# Patient Record
Sex: Female | Born: 1935
Health system: Southern US, Community
[De-identification: ages and names within clinical notes are randomized; demographics above are authoritative.]

## PROBLEM LIST (undated history)

## (undated) DIAGNOSIS — R42 Dizziness and giddiness: Secondary | ICD-10-CM

## (undated) DIAGNOSIS — J45909 Unspecified asthma, uncomplicated: Secondary | ICD-10-CM

## (undated) DIAGNOSIS — I10 Essential (primary) hypertension: Secondary | ICD-10-CM

## (undated) DIAGNOSIS — M1990 Unspecified osteoarthritis, unspecified site: Secondary | ICD-10-CM

## (undated) DIAGNOSIS — H269 Unspecified cataract: Secondary | ICD-10-CM

## (undated) DIAGNOSIS — Z974 Presence of external hearing-aid: Secondary | ICD-10-CM

## (undated) HISTORY — DX: Unspecified asthma, uncomplicated: J45.909

## (undated) HISTORY — PX: APPENDECTOMY: SHX54

## (undated) HISTORY — PX: CATARACT EXTRACTION: SUR2

## (undated) HISTORY — PX: TONSILLECTOMY: SUR1361

## (undated) HISTORY — DX: Unspecified cataract: H26.9

## (undated) HISTORY — DX: Unspecified osteoarthritis, unspecified site: M19.90

---

## 1997-07-13 ENCOUNTER — Other Ambulatory Visit: Admission: RE | Admit: 1997-07-13 | Discharge: 1997-07-13 | Payer: Self-pay | Admitting: Obstetrics and Gynecology

## 1997-07-13 ENCOUNTER — Ambulatory Visit (HOSPITAL_COMMUNITY): Admission: RE | Admit: 1997-07-13 | Discharge: 1997-07-13 | Payer: Self-pay | Admitting: Obstetrics and Gynecology

## 1997-07-26 ENCOUNTER — Ambulatory Visit (HOSPITAL_COMMUNITY): Admission: RE | Admit: 1997-07-26 | Discharge: 1997-07-26 | Payer: Self-pay | Admitting: Obstetrics and Gynecology

## 1998-10-25 ENCOUNTER — Other Ambulatory Visit: Admission: RE | Admit: 1998-10-25 | Discharge: 1998-10-25 | Payer: Self-pay | Admitting: Obstetrics and Gynecology

## 1998-11-15 ENCOUNTER — Ambulatory Visit (HOSPITAL_COMMUNITY): Admission: RE | Admit: 1998-11-15 | Discharge: 1998-11-15 | Payer: Self-pay | Admitting: Obstetrics and Gynecology

## 1998-11-15 ENCOUNTER — Encounter: Payer: Self-pay | Admitting: Obstetrics and Gynecology

## 1999-06-11 ENCOUNTER — Ambulatory Visit (HOSPITAL_COMMUNITY): Admission: RE | Admit: 1999-06-11 | Discharge: 1999-06-11 | Payer: Self-pay | Admitting: Obstetrics and Gynecology

## 1999-06-11 ENCOUNTER — Encounter (INDEPENDENT_AMBULATORY_CARE_PROVIDER_SITE_OTHER): Payer: Self-pay | Admitting: Specialist

## 1999-06-19 ENCOUNTER — Encounter: Admission: RE | Admit: 1999-06-19 | Discharge: 1999-06-19 | Payer: Self-pay | Admitting: Obstetrics and Gynecology

## 1999-06-19 ENCOUNTER — Encounter: Payer: Self-pay | Admitting: Obstetrics and Gynecology

## 1999-08-15 ENCOUNTER — Other Ambulatory Visit: Admission: RE | Admit: 1999-08-15 | Discharge: 1999-08-15 | Payer: Self-pay | Admitting: Obstetrics and Gynecology

## 1999-11-28 ENCOUNTER — Ambulatory Visit (HOSPITAL_COMMUNITY): Admission: RE | Admit: 1999-11-28 | Discharge: 1999-11-28 | Payer: Self-pay | Admitting: Obstetrics and Gynecology

## 1999-11-28 ENCOUNTER — Encounter: Payer: Self-pay | Admitting: Obstetrics and Gynecology

## 2000-10-23 ENCOUNTER — Other Ambulatory Visit: Admission: RE | Admit: 2000-10-23 | Discharge: 2000-10-23 | Payer: Self-pay | Admitting: Obstetrics and Gynecology

## 2001-02-10 ENCOUNTER — Encounter: Payer: Self-pay | Admitting: Orthopedic Surgery

## 2001-02-10 ENCOUNTER — Encounter: Admission: RE | Admit: 2001-02-10 | Discharge: 2001-02-10 | Payer: Self-pay | Admitting: Orthopedic Surgery

## 2001-07-14 ENCOUNTER — Ambulatory Visit (HOSPITAL_COMMUNITY): Admission: RE | Admit: 2001-07-14 | Discharge: 2001-07-14 | Payer: Self-pay | Admitting: Family Medicine

## 2001-07-14 ENCOUNTER — Encounter: Payer: Self-pay | Admitting: Family Medicine

## 2001-12-07 ENCOUNTER — Other Ambulatory Visit: Admission: RE | Admit: 2001-12-07 | Discharge: 2001-12-07 | Payer: Self-pay | Admitting: Obstetrics and Gynecology

## 2003-01-19 ENCOUNTER — Ambulatory Visit (HOSPITAL_COMMUNITY): Admission: RE | Admit: 2003-01-19 | Discharge: 2003-01-19 | Payer: Self-pay | Admitting: Obstetrics and Gynecology

## 2003-01-19 ENCOUNTER — Encounter: Payer: Self-pay | Admitting: Obstetrics and Gynecology

## 2003-11-08 ENCOUNTER — Other Ambulatory Visit: Admission: RE | Admit: 2003-11-08 | Discharge: 2003-11-08 | Payer: Self-pay | Admitting: Obstetrics and Gynecology

## 2004-02-29 ENCOUNTER — Ambulatory Visit (HOSPITAL_COMMUNITY): Admission: RE | Admit: 2004-02-29 | Discharge: 2004-02-29 | Payer: Self-pay | Admitting: Obstetrics and Gynecology

## 2004-04-25 ENCOUNTER — Encounter: Admission: RE | Admit: 2004-04-25 | Discharge: 2004-04-25 | Payer: Self-pay | Admitting: Obstetrics and Gynecology

## 2005-06-10 ENCOUNTER — Other Ambulatory Visit: Admission: RE | Admit: 2005-06-10 | Discharge: 2005-06-10 | Payer: Self-pay | Admitting: Obstetrics and Gynecology

## 2005-06-25 ENCOUNTER — Ambulatory Visit (HOSPITAL_COMMUNITY): Admission: RE | Admit: 2005-06-25 | Discharge: 2005-06-25 | Payer: Self-pay | Admitting: Obstetrics and Gynecology

## 2006-09-29 ENCOUNTER — Ambulatory Visit (HOSPITAL_COMMUNITY): Admission: RE | Admit: 2006-09-29 | Discharge: 2006-09-29 | Payer: Self-pay | Admitting: Obstetrics and Gynecology

## 2007-08-27 ENCOUNTER — Other Ambulatory Visit: Admission: RE | Admit: 2007-08-27 | Discharge: 2007-08-27 | Payer: Self-pay | Admitting: Obstetrics and Gynecology

## 2007-12-08 ENCOUNTER — Ambulatory Visit (HOSPITAL_COMMUNITY): Admission: RE | Admit: 2007-12-08 | Discharge: 2007-12-08 | Payer: Self-pay | Admitting: Obstetrics and Gynecology

## 2009-01-04 ENCOUNTER — Ambulatory Visit (HOSPITAL_COMMUNITY): Admission: RE | Admit: 2009-01-04 | Discharge: 2009-01-04 | Payer: Self-pay | Admitting: Obstetrics and Gynecology

## 2010-02-07 ENCOUNTER — Ambulatory Visit (HOSPITAL_COMMUNITY): Admission: RE | Admit: 2010-02-07 | Discharge: 2010-02-07 | Payer: Self-pay | Admitting: Obstetrics and Gynecology

## 2010-05-04 ENCOUNTER — Encounter: Payer: Self-pay | Admitting: Obstetrics and Gynecology

## 2010-08-30 NOTE — Op Note (Signed)
[facility]  Patient:    Sharon Hood, Sharon Hood                     MRN: [id number] Proc. Date: 06/11/99 Adm. Date:  [id number] Attending:  Amanda Cockayne                           Operative Report  DATE OF DICTATION:                1445, June 11, 1999.  PREOPERATIVE DIAGNOSIS:           1. Postmenopausal bleeding on Prempro.                                   2. Thickened endometrium on ultrasound.  POSTOPERATIVE DIAGNOSIS:          1. Postmenopausal bleeding on Prempro.                                   2. Thickened endometrium on ultrasound.  OPERATION:                        Dilatation and curettage.  SURGEON:                          Esmeralda Arthur, M.D.  ANESTHESIA:                       General.  PACKS:                            None.  MEDIUM:                           Sorbitol.  DEFICIT:                          10 cc.  FINDINGS:                         The uterus sounded 7 cm was dilated to #23 with no difficulty.  With hysteroscopy, both tube fallopians were visualized and polyps fibroids were seen.  The endometrium was not atrophic.  DESCRIPTION OF PROCEDURE:         The patient was carried to the operating room. After satisfactory anesthesia, the patient was placed in supine position, and she was prepped and draped in sterile field.                                    Examination revealed the uterus to be anterior, and I did not feel enlargement, mass in pelvis and adnexa.                                    The bladder with emptied by catheterization.  The weighted speculum was placed in the posterior vagina.  The cervix was grasped with the retinaculum, and the uterus was dilated with the first dilator and sounded 7 cm.  She was then dilated to a #23.  The observation scope was placed in, and we could see both tubal openings very well. Fundus looked atrophic.  She has  tissue on the posterior wall and the anterior wall.                                    We withdrew the scope, did curet with sharp curet and the serrated curet and got a moderate amount of tissue.                                    The procedure was then terminated.  The patient was carried to the recovery room in good condition. DD:  06/11/99 TD:  06/11/99 Job: [id number]

## 2011-02-21 ENCOUNTER — Other Ambulatory Visit: Payer: Self-pay | Admitting: Obstetrics and Gynecology

## 2011-02-21 DIAGNOSIS — Z1231 Encounter for screening mammogram for malignant neoplasm of breast: Secondary | ICD-10-CM

## 2011-02-27 ENCOUNTER — Ambulatory Visit (HOSPITAL_COMMUNITY)
Admission: RE | Admit: 2011-02-27 | Discharge: 2011-02-27 | Disposition: A | Discharge status: Home / Self Care | Payer: Medicare Other | Source: Ambulatory Visit | Attending: Obstetrics and Gynecology | Admitting: Obstetrics and Gynecology

## 2011-02-27 DIAGNOSIS — Z1231 Encounter for screening mammogram for malignant neoplasm of breast: Secondary | ICD-10-CM | POA: Insufficient documentation

## 2011-05-21 DIAGNOSIS — Z01419 Encounter for gynecological examination (general) (routine) without abnormal findings: Secondary | ICD-10-CM | POA: Diagnosis not present

## 2011-05-21 DIAGNOSIS — Z124 Encounter for screening for malignant neoplasm of cervix: Secondary | ICD-10-CM | POA: Diagnosis not present

## 2011-05-22 DIAGNOSIS — IMO0001 Reserved for inherently not codable concepts without codable children: Secondary | ICD-10-CM | POA: Diagnosis not present

## 2011-05-22 DIAGNOSIS — J45902 Unspecified asthma with status asthmaticus: Secondary | ICD-10-CM | POA: Diagnosis not present

## 2011-05-22 DIAGNOSIS — M3320 Polymyositis, organ involvement unspecified: Secondary | ICD-10-CM | POA: Diagnosis not present

## 2011-06-02 DIAGNOSIS — M2550 Pain in unspecified joint: Secondary | ICD-10-CM | POA: Diagnosis not present

## 2011-06-19 DIAGNOSIS — M2550 Pain in unspecified joint: Secondary | ICD-10-CM | POA: Diagnosis not present

## 2011-06-25 DIAGNOSIS — L821 Other seborrheic keratosis: Secondary | ICD-10-CM | POA: Diagnosis not present

## 2011-06-25 DIAGNOSIS — L570 Actinic keratosis: Secondary | ICD-10-CM | POA: Diagnosis not present

## 2011-06-25 DIAGNOSIS — Z85828 Personal history of other malignant neoplasm of skin: Secondary | ICD-10-CM | POA: Diagnosis not present

## 2011-06-25 DIAGNOSIS — D239 Other benign neoplasm of skin, unspecified: Secondary | ICD-10-CM | POA: Diagnosis not present

## 2011-07-03 DIAGNOSIS — M353 Polymyalgia rheumatica: Secondary | ICD-10-CM | POA: Diagnosis not present

## 2011-07-03 DIAGNOSIS — M159 Polyosteoarthritis, unspecified: Secondary | ICD-10-CM | POA: Diagnosis not present

## 2011-08-14 DIAGNOSIS — M353 Polymyalgia rheumatica: Secondary | ICD-10-CM | POA: Diagnosis not present

## 2011-08-14 DIAGNOSIS — M159 Polyosteoarthritis, unspecified: Secondary | ICD-10-CM | POA: Diagnosis not present

## 2011-12-16 DIAGNOSIS — M159 Polyosteoarthritis, unspecified: Secondary | ICD-10-CM | POA: Diagnosis not present

## 2011-12-16 DIAGNOSIS — M353 Polymyalgia rheumatica: Secondary | ICD-10-CM | POA: Diagnosis not present

## 2012-01-14 DIAGNOSIS — Z85828 Personal history of other malignant neoplasm of skin: Secondary | ICD-10-CM | POA: Diagnosis not present

## 2012-01-14 DIAGNOSIS — D239 Other benign neoplasm of skin, unspecified: Secondary | ICD-10-CM | POA: Diagnosis not present

## 2012-01-14 DIAGNOSIS — L570 Actinic keratosis: Secondary | ICD-10-CM | POA: Diagnosis not present

## 2012-01-14 DIAGNOSIS — L820 Inflamed seborrheic keratosis: Secondary | ICD-10-CM | POA: Diagnosis not present

## 2012-01-14 DIAGNOSIS — I781 Nevus, non-neoplastic: Secondary | ICD-10-CM | POA: Diagnosis not present

## 2012-01-14 DIAGNOSIS — L821 Other seborrheic keratosis: Secondary | ICD-10-CM | POA: Diagnosis not present

## 2012-04-19 DIAGNOSIS — M159 Polyosteoarthritis, unspecified: Secondary | ICD-10-CM | POA: Diagnosis not present

## 2012-04-19 DIAGNOSIS — M353 Polymyalgia rheumatica: Secondary | ICD-10-CM | POA: Diagnosis not present

## 2012-04-27 DIAGNOSIS — Z23 Encounter for immunization: Secondary | ICD-10-CM | POA: Diagnosis not present

## 2012-07-29 DIAGNOSIS — I781 Nevus, non-neoplastic: Secondary | ICD-10-CM | POA: Diagnosis not present

## 2012-07-29 DIAGNOSIS — Z85828 Personal history of other malignant neoplasm of skin: Secondary | ICD-10-CM | POA: Diagnosis not present

## 2012-07-29 DIAGNOSIS — L909 Atrophic disorder of skin, unspecified: Secondary | ICD-10-CM | POA: Diagnosis not present

## 2012-07-29 DIAGNOSIS — L570 Actinic keratosis: Secondary | ICD-10-CM | POA: Diagnosis not present

## 2012-07-29 DIAGNOSIS — L821 Other seborrheic keratosis: Secondary | ICD-10-CM | POA: Diagnosis not present

## 2012-07-29 DIAGNOSIS — L919 Hypertrophic disorder of the skin, unspecified: Secondary | ICD-10-CM | POA: Diagnosis not present

## 2012-07-29 DIAGNOSIS — D239 Other benign neoplasm of skin, unspecified: Secondary | ICD-10-CM | POA: Diagnosis not present

## 2012-07-29 DIAGNOSIS — L820 Inflamed seborrheic keratosis: Secondary | ICD-10-CM | POA: Diagnosis not present

## 2012-08-14 DIAGNOSIS — S81009A Unspecified open wound, unspecified knee, initial encounter: Secondary | ICD-10-CM | POA: Diagnosis not present

## 2012-08-14 DIAGNOSIS — Z23 Encounter for immunization: Secondary | ICD-10-CM | POA: Diagnosis not present

## 2012-08-14 DIAGNOSIS — S81809A Unspecified open wound, unspecified lower leg, initial encounter: Secondary | ICD-10-CM | POA: Diagnosis not present

## 2012-08-14 DIAGNOSIS — M79609 Pain in unspecified limb: Secondary | ICD-10-CM | POA: Diagnosis not present

## 2012-08-14 DIAGNOSIS — Z79899 Other long term (current) drug therapy: Secondary | ICD-10-CM | POA: Diagnosis not present

## 2012-08-16 DIAGNOSIS — T148XXA Other injury of unspecified body region, initial encounter: Secondary | ICD-10-CM | POA: Diagnosis not present

## 2012-08-27 DIAGNOSIS — T148XXA Other injury of unspecified body region, initial encounter: Secondary | ICD-10-CM | POA: Diagnosis not present

## 2012-10-26 DIAGNOSIS — H9190 Unspecified hearing loss, unspecified ear: Secondary | ICD-10-CM | POA: Diagnosis not present

## 2012-10-26 DIAGNOSIS — H6120 Impacted cerumen, unspecified ear: Secondary | ICD-10-CM | POA: Diagnosis not present

## 2013-01-13 DIAGNOSIS — L820 Inflamed seborrheic keratosis: Secondary | ICD-10-CM | POA: Diagnosis not present

## 2013-01-13 DIAGNOSIS — L821 Other seborrheic keratosis: Secondary | ICD-10-CM | POA: Diagnosis not present

## 2013-01-13 DIAGNOSIS — L570 Actinic keratosis: Secondary | ICD-10-CM | POA: Diagnosis not present

## 2013-01-13 DIAGNOSIS — M6740 Ganglion, unspecified site: Secondary | ICD-10-CM | POA: Diagnosis not present

## 2013-01-13 DIAGNOSIS — C44621 Squamous cell carcinoma of skin of unspecified upper limb, including shoulder: Secondary | ICD-10-CM | POA: Diagnosis not present

## 2013-01-13 DIAGNOSIS — D485 Neoplasm of uncertain behavior of skin: Secondary | ICD-10-CM | POA: Diagnosis not present

## 2013-02-07 DIAGNOSIS — S298XXA Other specified injuries of thorax, initial encounter: Secondary | ICD-10-CM | POA: Diagnosis not present

## 2013-02-07 DIAGNOSIS — S0190XA Unspecified open wound of unspecified part of head, initial encounter: Secondary | ICD-10-CM | POA: Diagnosis not present

## 2013-02-07 DIAGNOSIS — IMO0002 Reserved for concepts with insufficient information to code with codable children: Secondary | ICD-10-CM | POA: Diagnosis not present

## 2013-02-07 DIAGNOSIS — R509 Fever, unspecified: Secondary | ICD-10-CM | POA: Diagnosis not present

## 2013-02-07 DIAGNOSIS — R799 Abnormal finding of blood chemistry, unspecified: Secondary | ICD-10-CM | POA: Diagnosis not present

## 2013-02-07 DIAGNOSIS — S0180XA Unspecified open wound of other part of head, initial encounter: Secondary | ICD-10-CM | POA: Diagnosis not present

## 2013-02-07 DIAGNOSIS — S0993XA Unspecified injury of face, initial encounter: Secondary | ICD-10-CM | POA: Diagnosis not present

## 2013-02-07 DIAGNOSIS — Z882 Allergy status to sulfonamides status: Secondary | ICD-10-CM | POA: Diagnosis not present

## 2013-02-07 DIAGNOSIS — Z23 Encounter for immunization: Secondary | ICD-10-CM | POA: Diagnosis not present

## 2013-02-07 DIAGNOSIS — Z79899 Other long term (current) drug therapy: Secondary | ICD-10-CM | POA: Diagnosis not present

## 2013-02-07 DIAGNOSIS — R55 Syncope and collapse: Secondary | ICD-10-CM | POA: Diagnosis not present

## 2013-02-07 DIAGNOSIS — T148XXA Other injury of unspecified body region, initial encounter: Secondary | ICD-10-CM | POA: Diagnosis not present

## 2013-02-08 DIAGNOSIS — R509 Fever, unspecified: Secondary | ICD-10-CM | POA: Diagnosis not present

## 2013-02-08 DIAGNOSIS — J438 Other emphysema: Secondary | ICD-10-CM | POA: Diagnosis not present

## 2013-02-08 DIAGNOSIS — R55 Syncope and collapse: Secondary | ICD-10-CM | POA: Diagnosis not present

## 2013-02-08 DIAGNOSIS — S79919A Unspecified injury of unspecified hip, initial encounter: Secondary | ICD-10-CM | POA: Diagnosis not present

## 2013-02-08 DIAGNOSIS — M25559 Pain in unspecified hip: Secondary | ICD-10-CM | POA: Diagnosis not present

## 2013-02-08 DIAGNOSIS — I369 Nonrheumatic tricuspid valve disorder, unspecified: Secondary | ICD-10-CM | POA: Diagnosis not present

## 2013-02-08 DIAGNOSIS — I059 Rheumatic mitral valve disease, unspecified: Secondary | ICD-10-CM | POA: Diagnosis not present

## 2013-02-08 DIAGNOSIS — R748 Abnormal levels of other serum enzymes: Secondary | ICD-10-CM | POA: Diagnosis not present

## 2013-02-09 DIAGNOSIS — Z136 Encounter for screening for cardiovascular disorders: Secondary | ICD-10-CM | POA: Diagnosis not present

## 2013-02-09 DIAGNOSIS — R55 Syncope and collapse: Secondary | ICD-10-CM | POA: Diagnosis not present

## 2013-02-12 DIAGNOSIS — R55 Syncope and collapse: Secondary | ICD-10-CM | POA: Diagnosis not present

## 2013-02-15 DIAGNOSIS — C44621 Squamous cell carcinoma of skin of unspecified upper limb, including shoulder: Secondary | ICD-10-CM | POA: Diagnosis not present

## 2013-02-15 DIAGNOSIS — L905 Scar conditions and fibrosis of skin: Secondary | ICD-10-CM | POA: Diagnosis not present

## 2013-03-07 DIAGNOSIS — R002 Palpitations: Secondary | ICD-10-CM | POA: Diagnosis not present

## 2013-03-07 DIAGNOSIS — R55 Syncope and collapse: Secondary | ICD-10-CM | POA: Diagnosis not present

## 2013-03-11 DIAGNOSIS — R55 Syncope and collapse: Secondary | ICD-10-CM | POA: Diagnosis not present

## 2013-03-11 DIAGNOSIS — T148XXA Other injury of unspecified body region, initial encounter: Secondary | ICD-10-CM | POA: Diagnosis not present

## 2013-03-24 DIAGNOSIS — M545 Low back pain: Secondary | ICD-10-CM | POA: Diagnosis not present

## 2013-03-24 DIAGNOSIS — I10 Essential (primary) hypertension: Secondary | ICD-10-CM | POA: Diagnosis not present

## 2013-03-24 DIAGNOSIS — J45902 Unspecified asthma with status asthmaticus: Secondary | ICD-10-CM | POA: Diagnosis not present

## 2013-03-24 DIAGNOSIS — R55 Syncope and collapse: Secondary | ICD-10-CM | POA: Diagnosis not present

## 2013-03-30 DIAGNOSIS — J45909 Unspecified asthma, uncomplicated: Secondary | ICD-10-CM | POA: Diagnosis not present

## 2013-03-30 DIAGNOSIS — R55 Syncope and collapse: Secondary | ICD-10-CM | POA: Diagnosis not present

## 2013-04-15 DIAGNOSIS — R55 Syncope and collapse: Secondary | ICD-10-CM | POA: Diagnosis not present

## 2013-04-15 DIAGNOSIS — I498 Other specified cardiac arrhythmias: Secondary | ICD-10-CM | POA: Diagnosis not present

## 2013-04-15 DIAGNOSIS — R002 Palpitations: Secondary | ICD-10-CM | POA: Diagnosis not present

## 2013-04-15 DIAGNOSIS — I4949 Other premature depolarization: Secondary | ICD-10-CM | POA: Diagnosis not present

## 2013-04-19 DIAGNOSIS — J45909 Unspecified asthma, uncomplicated: Secondary | ICD-10-CM | POA: Diagnosis not present

## 2013-04-19 DIAGNOSIS — Z882 Allergy status to sulfonamides status: Secondary | ICD-10-CM | POA: Diagnosis not present

## 2013-04-19 DIAGNOSIS — R55 Syncope and collapse: Secondary | ICD-10-CM | POA: Diagnosis not present

## 2013-04-27 DIAGNOSIS — R55 Syncope and collapse: Secondary | ICD-10-CM | POA: Diagnosis not present

## 2013-04-27 DIAGNOSIS — Z4509 Encounter for adjustment and management of other cardiac device: Secondary | ICD-10-CM | POA: Diagnosis not present

## 2013-07-21 DIAGNOSIS — Z4509 Encounter for adjustment and management of other cardiac device: Secondary | ICD-10-CM | POA: Diagnosis not present

## 2013-07-21 DIAGNOSIS — Z95818 Presence of other cardiac implants and grafts: Secondary | ICD-10-CM | POA: Diagnosis not present

## 2013-07-21 DIAGNOSIS — R55 Syncope and collapse: Secondary | ICD-10-CM | POA: Diagnosis not present

## 2013-08-09 DIAGNOSIS — D239 Other benign neoplasm of skin, unspecified: Secondary | ICD-10-CM | POA: Diagnosis not present

## 2013-08-09 DIAGNOSIS — L821 Other seborrheic keratosis: Secondary | ICD-10-CM | POA: Diagnosis not present

## 2013-08-09 DIAGNOSIS — C44711 Basal cell carcinoma of skin of unspecified lower limb, including hip: Secondary | ICD-10-CM | POA: Diagnosis not present

## 2013-08-09 DIAGNOSIS — L820 Inflamed seborrheic keratosis: Secondary | ICD-10-CM | POA: Diagnosis not present

## 2013-08-09 DIAGNOSIS — D485 Neoplasm of uncertain behavior of skin: Secondary | ICD-10-CM | POA: Diagnosis not present

## 2013-08-09 DIAGNOSIS — L570 Actinic keratosis: Secondary | ICD-10-CM | POA: Diagnosis not present

## 2013-08-09 DIAGNOSIS — Z85828 Personal history of other malignant neoplasm of skin: Secondary | ICD-10-CM | POA: Diagnosis not present

## 2013-09-14 DIAGNOSIS — C44711 Basal cell carcinoma of skin of unspecified lower limb, including hip: Secondary | ICD-10-CM | POA: Diagnosis not present

## 2013-09-28 DIAGNOSIS — R55 Syncope and collapse: Secondary | ICD-10-CM | POA: Diagnosis not present

## 2013-09-28 DIAGNOSIS — Z95818 Presence of other cardiac implants and grafts: Secondary | ICD-10-CM | POA: Diagnosis not present

## 2013-10-28 DIAGNOSIS — R55 Syncope and collapse: Secondary | ICD-10-CM | POA: Diagnosis not present

## 2013-10-28 DIAGNOSIS — I10 Essential (primary) hypertension: Secondary | ICD-10-CM | POA: Diagnosis not present

## 2013-12-02 DIAGNOSIS — R55 Syncope and collapse: Secondary | ICD-10-CM | POA: Diagnosis not present

## 2013-12-02 DIAGNOSIS — Z95818 Presence of other cardiac implants and grafts: Secondary | ICD-10-CM | POA: Diagnosis not present

## 2013-12-07 DIAGNOSIS — Z95818 Presence of other cardiac implants and grafts: Secondary | ICD-10-CM | POA: Diagnosis not present

## 2013-12-07 DIAGNOSIS — I4891 Unspecified atrial fibrillation: Secondary | ICD-10-CM | POA: Diagnosis not present

## 2013-12-07 DIAGNOSIS — R55 Syncope and collapse: Secondary | ICD-10-CM | POA: Diagnosis not present

## 2013-12-27 DIAGNOSIS — R55 Syncope and collapse: Secondary | ICD-10-CM | POA: Diagnosis not present

## 2013-12-27 DIAGNOSIS — Z95818 Presence of other cardiac implants and grafts: Secondary | ICD-10-CM | POA: Diagnosis not present

## 2013-12-29 DIAGNOSIS — Z95818 Presence of other cardiac implants and grafts: Secondary | ICD-10-CM | POA: Diagnosis not present

## 2013-12-29 DIAGNOSIS — R55 Syncope and collapse: Secondary | ICD-10-CM | POA: Diagnosis not present

## 2014-01-17 DIAGNOSIS — R55 Syncope and collapse: Secondary | ICD-10-CM | POA: Diagnosis not present

## 2014-01-17 DIAGNOSIS — Z959 Presence of cardiac and vascular implant and graft, unspecified: Secondary | ICD-10-CM | POA: Diagnosis not present

## 2014-03-24 DIAGNOSIS — Z23 Encounter for immunization: Secondary | ICD-10-CM | POA: Diagnosis not present

## 2014-04-14 DIAGNOSIS — R55 Syncope and collapse: Secondary | ICD-10-CM

## 2014-04-14 HISTORY — DX: Syncope and collapse: R55

## 2014-04-18 DIAGNOSIS — D239 Other benign neoplasm of skin, unspecified: Secondary | ICD-10-CM | POA: Diagnosis not present

## 2014-04-18 DIAGNOSIS — L570 Actinic keratosis: Secondary | ICD-10-CM | POA: Diagnosis not present

## 2014-04-18 DIAGNOSIS — L821 Other seborrheic keratosis: Secondary | ICD-10-CM | POA: Diagnosis not present

## 2014-04-18 DIAGNOSIS — Z85828 Personal history of other malignant neoplasm of skin: Secondary | ICD-10-CM | POA: Diagnosis not present

## 2014-04-18 DIAGNOSIS — D229 Melanocytic nevi, unspecified: Secondary | ICD-10-CM | POA: Diagnosis not present

## 2014-04-24 DIAGNOSIS — H6121 Impacted cerumen, right ear: Secondary | ICD-10-CM | POA: Diagnosis not present

## 2014-04-24 DIAGNOSIS — J4530 Mild persistent asthma, uncomplicated: Secondary | ICD-10-CM | POA: Diagnosis not present

## 2014-04-24 DIAGNOSIS — Z1389 Encounter for screening for other disorder: Secondary | ICD-10-CM | POA: Diagnosis not present

## 2014-04-24 DIAGNOSIS — R002 Palpitations: Secondary | ICD-10-CM | POA: Diagnosis not present

## 2014-06-27 DIAGNOSIS — R55 Syncope and collapse: Secondary | ICD-10-CM | POA: Diagnosis not present

## 2014-06-27 DIAGNOSIS — Z95818 Presence of other cardiac implants and grafts: Secondary | ICD-10-CM | POA: Diagnosis not present

## 2014-09-25 DIAGNOSIS — R55 Syncope and collapse: Secondary | ICD-10-CM | POA: Diagnosis not present

## 2014-09-25 DIAGNOSIS — Z959 Presence of cardiac and vascular implant and graft, unspecified: Secondary | ICD-10-CM | POA: Diagnosis not present

## 2014-11-15 DIAGNOSIS — L821 Other seborrheic keratosis: Secondary | ICD-10-CM | POA: Diagnosis not present

## 2014-11-15 DIAGNOSIS — Z85828 Personal history of other malignant neoplasm of skin: Secondary | ICD-10-CM | POA: Diagnosis not present

## 2014-11-15 DIAGNOSIS — L219 Seborrheic dermatitis, unspecified: Secondary | ICD-10-CM | POA: Diagnosis not present

## 2014-11-15 DIAGNOSIS — D2272 Melanocytic nevi of left lower limb, including hip: Secondary | ICD-10-CM | POA: Diagnosis not present

## 2014-11-15 DIAGNOSIS — L820 Inflamed seborrheic keratosis: Secondary | ICD-10-CM | POA: Diagnosis not present

## 2014-11-15 DIAGNOSIS — L570 Actinic keratosis: Secondary | ICD-10-CM | POA: Diagnosis not present

## 2015-01-23 DIAGNOSIS — H6123 Impacted cerumen, bilateral: Secondary | ICD-10-CM | POA: Diagnosis not present

## 2015-01-23 DIAGNOSIS — H9113 Presbycusis, bilateral: Secondary | ICD-10-CM | POA: Diagnosis not present

## 2015-02-12 DIAGNOSIS — Z23 Encounter for immunization: Secondary | ICD-10-CM | POA: Diagnosis not present

## 2015-04-24 DIAGNOSIS — Z23 Encounter for immunization: Secondary | ICD-10-CM | POA: Diagnosis not present

## 2015-04-24 DIAGNOSIS — R002 Palpitations: Secondary | ICD-10-CM | POA: Diagnosis not present

## 2015-04-24 DIAGNOSIS — J4530 Mild persistent asthma, uncomplicated: Secondary | ICD-10-CM | POA: Diagnosis not present

## 2015-06-05 DIAGNOSIS — L820 Inflamed seborrheic keratosis: Secondary | ICD-10-CM | POA: Diagnosis not present

## 2015-06-05 DIAGNOSIS — D2272 Melanocytic nevi of left lower limb, including hip: Secondary | ICD-10-CM | POA: Diagnosis not present

## 2015-06-05 DIAGNOSIS — Z85828 Personal history of other malignant neoplasm of skin: Secondary | ICD-10-CM | POA: Diagnosis not present

## 2015-06-05 DIAGNOSIS — L570 Actinic keratosis: Secondary | ICD-10-CM | POA: Diagnosis not present

## 2015-06-05 DIAGNOSIS — D229 Melanocytic nevi, unspecified: Secondary | ICD-10-CM | POA: Diagnosis not present

## 2015-06-05 DIAGNOSIS — Z23 Encounter for immunization: Secondary | ICD-10-CM | POA: Diagnosis not present

## 2015-06-05 DIAGNOSIS — L219 Seborrheic dermatitis, unspecified: Secondary | ICD-10-CM | POA: Diagnosis not present

## 2015-06-05 DIAGNOSIS — L821 Other seborrheic keratosis: Secondary | ICD-10-CM | POA: Diagnosis not present

## 2015-07-31 DIAGNOSIS — H903 Sensorineural hearing loss, bilateral: Secondary | ICD-10-CM | POA: Diagnosis not present

## 2015-07-31 DIAGNOSIS — H6121 Impacted cerumen, right ear: Secondary | ICD-10-CM | POA: Diagnosis not present

## 2015-11-05 DIAGNOSIS — G6289 Other specified polyneuropathies: Secondary | ICD-10-CM | POA: Diagnosis not present

## 2015-11-05 DIAGNOSIS — Z6822 Body mass index (BMI) 22.0-22.9, adult: Secondary | ICD-10-CM | POA: Diagnosis not present

## 2015-12-06 DIAGNOSIS — C44519 Basal cell carcinoma of skin of other part of trunk: Secondary | ICD-10-CM | POA: Diagnosis not present

## 2015-12-06 DIAGNOSIS — D0471 Carcinoma in situ of skin of right lower limb, including hip: Secondary | ICD-10-CM | POA: Diagnosis not present

## 2015-12-06 DIAGNOSIS — Z85828 Personal history of other malignant neoplasm of skin: Secondary | ICD-10-CM | POA: Diagnosis not present

## 2015-12-06 DIAGNOSIS — L570 Actinic keratosis: Secondary | ICD-10-CM | POA: Diagnosis not present

## 2015-12-06 DIAGNOSIS — L821 Other seborrheic keratosis: Secondary | ICD-10-CM | POA: Diagnosis not present

## 2015-12-06 DIAGNOSIS — D2272 Melanocytic nevi of left lower limb, including hip: Secondary | ICD-10-CM | POA: Diagnosis not present

## 2015-12-06 DIAGNOSIS — D485 Neoplasm of uncertain behavior of skin: Secondary | ICD-10-CM | POA: Diagnosis not present

## 2015-12-27 DIAGNOSIS — D0471 Carcinoma in situ of skin of right lower limb, including hip: Secondary | ICD-10-CM | POA: Diagnosis not present

## 2015-12-27 DIAGNOSIS — C44519 Basal cell carcinoma of skin of other part of trunk: Secondary | ICD-10-CM | POA: Diagnosis not present

## 2016-01-22 ENCOUNTER — Ambulatory Visit (INDEPENDENT_AMBULATORY_CARE_PROVIDER_SITE_OTHER): Payer: Medicare Other | Admitting: Neurology

## 2016-01-22 ENCOUNTER — Encounter: Payer: Self-pay | Admitting: Neurology

## 2016-01-22 VITALS — BP 108/64 | HR 64 | Ht 63.5 in | Wt 125.0 lb

## 2016-01-22 DIAGNOSIS — E538 Deficiency of other specified B group vitamins: Secondary | ICD-10-CM

## 2016-01-22 DIAGNOSIS — R202 Paresthesia of skin: Secondary | ICD-10-CM | POA: Insufficient documentation

## 2016-01-22 NOTE — Patient Instructions (Signed)
   We will get blood work today and get EMG and NCV to look at nerve function of the legs.

## 2016-01-22 NOTE — Progress Notes (Signed)
Reason for visit: Paresthesia  Referring physician: Dr. Gypsy Lore Sharon Hood is a 82 y.o. female  History of present illness:  Sharon Hood is an 82 year old right-handed white female with a history of numbness and tingling in the feet that she noticed about 3 months ago. The patient awoke one morning and noticed the numbness, she does not believe that the numbness has progressed much since that time. She thought that there may be some numbness of the fifth finger on the left hand, but this has improved. The patient denies any numbness or paresthesias above the ankle, most of the sensory changes are in the ball of the foot bilaterally, right equal to left. The patient denies weakness of the arms or legs, she denies any balance issues. She denies back pain or pain down the legs, she has not had any discomfort at night, she is able to sleep well. She denies difficulty controlling the bowels or the bladder. She comes to this office for an evaluation of the sensory changes.  Past Medical History:  Diagnosis Date  . Asthma     Past Surgical History:  Procedure Laterality Date  . APPENDECTOMY    . TONSILLECTOMY     82 yr old    Family History  Problem Relation Age of Onset  . Other Mother     32 yrs old  . Cancer Father   . Asthma Brother     Social history:  reports that she quit smoking about 53 years ago. She has never used smokeless tobacco. She reports that she drinks alcohol. She reports that she does not use drugs.  Medications:  Prior to Admission medications   Not on File      Allergies  Allergen Reactions  . Sulfa Antibiotics Nausea Only    ROS:  Out of a complete 14 system review of symptoms, the patient complains only of the following symptoms, and all other reviewed systems are negative.  Easy bruising Joint pain Numbness in the feet, syncope  Blood pressure 108/64, pulse 64, height 5' 3.5" (1.613 m), weight 125 lb (56.7 kg).  Physical  Exam  General: The patient is alert and cooperative at the time of the examination.  Eyes: Pupils are equal, round, and reactive to light. Discs are flat bilaterally.  Neck: The neck is supple, no carotid bruits are noted.  Respiratory: The respiratory examination is clear.  Cardiovascular: The cardiovascular examination reveals a regular rate and rhythm, no obvious murmurs or rubs are noted.  Skin: Extremities are without significant edema.  Neurologic Exam  Mental status: The patient is alert and oriented x 3 at the time of the examination. The patient has apparent normal recent and remote memory, with an apparently normal attention span and concentration ability.  Cranial nerves: Facial symmetry is present. There is good sensation of the face to pinprick and soft touch bilaterally. The strength of the facial muscles and the muscles to head turning and shoulder shrug are normal bilaterally. Speech is well enunciated, no aphasia or dysarthria is noted. Extraocular movements are full. Visual fields are full. The tongue is midline, and the patient has symmetric elevation of the soft palate. No obvious hearing deficits are noted.  Motor: The motor testing reveals 5 over 5 strength of all 4 extremities. Good symmetric motor tone is noted throughout.  Sensory: Sensory testing is intact to pinprick, soft touch, vibration sensation, and position sense on all 4 extremities. No evidence of extinction is noted.  Coordination:  Cerebellar testing reveals good finger-nose-finger and heel-to-shin bilaterally.  Gait and station: Gait is normal. Tandem gait is normal. Romberg is negative. No drift is seen.  Reflexes: Deep tendon reflexes are symmetric, but are depressed bilaterally. Toes are downgoing bilaterally.   Assessment/Plan:  1. Foot numbness, possible early peripheral neuropathy  The patient may have early symptoms of a peripheral neuropathy. The patient has numbness only in the ball of  the feet bilaterally. She denies any significant discomfort. We will check blood work today, set the patient up for EMG and nerve conduction study evaluation with nerve conductions on both legs, EMG on one leg. She will follow-up for the above study.  Sharon Alexanders MD 01/22/2016 6:50 PM  [facility] [address] [place], [place] [address]  Phone [phone] Fax [phone]

## 2016-01-24 LAB — MULTIPLE MYELOMA PANEL, SERUM
ALBUMIN SERPL ELPH-MCNC: 4.1 g/dL (ref 2.9–4.4)
ALPHA 1: 0.2 g/dL (ref 0.0–0.4)
ALPHA2 GLOB SERPL ELPH-MCNC: 0.6 g/dL (ref 0.4–1.0)
Albumin/Glob SerPl: 1.8 — ABNORMAL HIGH (ref 0.7–1.7)
B-GLOBULIN SERPL ELPH-MCNC: 0.8 g/dL (ref 0.7–1.3)
GAMMA GLOB SERPL ELPH-MCNC: 0.7 g/dL (ref 0.4–1.8)
GLOBULIN, TOTAL: 2.4 g/dL (ref 2.2–3.9)
IGG (IMMUNOGLOBIN G), SERUM: 835 mg/dL (ref 700–1600)
IgA/Immunoglobulin A, Serum: 175 mg/dL (ref 64–422)
IgM (Immunoglobulin M), Srm: 31 mg/dL (ref 26–217)
TOTAL PROTEIN: 6.5 g/dL (ref 6.0–8.5)

## 2016-01-24 LAB — RHEUMATOID FACTOR: Rhuematoid fact SerPl-aCnc: <10 IU/mL (ref 0.0–13.9)

## 2016-01-24 LAB — ANA W/REFLEX: ANA: NEGATIVE

## 2016-01-24 LAB — VITAMIN B12: VITAMIN B 12: 397 pg/mL (ref 211–946)

## 2016-01-24 LAB — SEDIMENTATION RATE: SED RATE: 2 mm/h (ref 0–40)

## 2016-01-24 LAB — B. BURGDORFI ANTIBODIES

## 2016-01-24 LAB — ANGIOTENSIN CONVERTING ENZYME: Angio Convert Enzyme: 31 U/L (ref 14–82)

## 2016-01-31 ENCOUNTER — Telehealth: Payer: Self-pay

## 2016-01-31 NOTE — Telephone Encounter (Signed)
-----   Message from Kathrynn Ducking, MD sent at 01/24/2016  4:50 PM EDT -----  The blood work results are unremarkable. Please call the patient.  ----- Message ----- From: Lavone Neri Lab Results In Sent: 01/23/2016   7:42 AM To: Kathrynn Ducking, MD

## 2016-01-31 NOTE — Telephone Encounter (Signed)
Called pt w/ unremarkable lab results. Verbalized understanding and appreciation for call. 

## 2016-02-19 ENCOUNTER — Encounter: Payer: Self-pay | Admitting: Neurology

## 2016-02-19 ENCOUNTER — Ambulatory Visit (INDEPENDENT_AMBULATORY_CARE_PROVIDER_SITE_OTHER): Payer: Self-pay | Admitting: Neurology

## 2016-02-19 ENCOUNTER — Ambulatory Visit (INDEPENDENT_AMBULATORY_CARE_PROVIDER_SITE_OTHER): Payer: Medicare Other | Admitting: Neurology

## 2016-02-19 DIAGNOSIS — R202 Paresthesia of skin: Secondary | ICD-10-CM | POA: Diagnosis not present

## 2016-02-19 NOTE — Progress Notes (Signed)
Please refer to EMG and nerve conduction study procedure note. 

## 2016-02-19 NOTE — Procedures (Signed)
     HISTORY:  Sharon Hood is an 82 year old patient with a history of numbness in the toes and balls of the feet bilaterally. The patient denies any low back pain or pain down the legs or any weakness of the legs. The patient is being evaluated for a possible peripheral neuropathy.  NERVE CONDUCTION STUDIES:  Nerve conduction studies were performed on both lower extremities. The distal motor latencies and motor amplitudes for the peroneal and posterior tibial nerves were within normal limits. The nerve conduction velocities for these nerves were also normal. The H reflex latencies were normal. The sensory latencies for the peroneal nerves were within normal limits.   EMG STUDIES:  EMG study was performed on the right lower extremity:  The tibialis anterior muscle reveals 2 to 4K motor units with decreased recruitment. No fibrillations or positive waves were seen. The peroneus tertius muscle reveals 2 to 4K motor units with decreased recruitment. No fibrillations or positive waves were seen. The medial gastrocnemius muscle reveals 1 to 3K motor units with full recruitment. No fibrillations or positive waves were seen. The vastus lateralis muscle reveals 2 to 4K motor units with full recruitment. No fibrillations or positive waves were seen. The iliopsoas muscle reveals 1 to 2K motor units with full recruitment. No fibrillations or positive waves were seen. Complex repetitive discharges were seen The biceps femoris muscle (long head) reveals 1 to 2K motor units with full recruitment. No fibrillations or positive waves were seen. Complex repetitive discharges were seen.  The lumbosacral paraspinal muscles were tested at 3 levels, and revealed no abnormalities of insertional activity at the middle and lower levels. Complex repetitive discharges were seen at the upper level.  There was good relaxation.   IMPRESSION:  Nerve conduction studies done on both lower extremities were within normal  limits. No evidence of a peripheral neuropathy was noted, but an early peripheral neuropathy or a small fiber neuropathy may be missed by nerve conduction studies. Clinical correlation is required. EMG evaluation of the right lower extremity shows some proximal muscle changes that could be consistent with an early myopathy. No clear evidence of an overlying lumbosacral radiculopathy was noted.  Jill Alexanders MD 02/19/2016 1:36 PM  [facility] [address] [place],  [address]  Phone [phone] Fax [phone]

## 2016-02-19 NOTE — Progress Notes (Signed)
The patient comes in for EMG and nerve conduction study evaluation today. There is no clear evidence of a peripheral neuropathy, but an early peripheral neuropathy could be missed.  EMG evaluation suggested some mild myopathic changes proximally, the patient reports no weakness with getting up out of a chair or walking upstairs.  Will follow-up in about 6 months, for signs of developing peripheral neuropathy or evidence of proximal muscle weakness.

## 2016-03-03 DIAGNOSIS — Z23 Encounter for immunization: Secondary | ICD-10-CM | POA: Diagnosis not present

## 2016-03-11 DIAGNOSIS — H02831 Dermatochalasis of right upper eyelid: Secondary | ICD-10-CM | POA: Diagnosis not present

## 2016-03-11 DIAGNOSIS — H01022 Squamous blepharitis right lower eyelid: Secondary | ICD-10-CM | POA: Diagnosis not present

## 2016-03-11 DIAGNOSIS — H01025 Squamous blepharitis left lower eyelid: Secondary | ICD-10-CM | POA: Diagnosis not present

## 2016-03-11 DIAGNOSIS — H02834 Dermatochalasis of left upper eyelid: Secondary | ICD-10-CM | POA: Diagnosis not present

## 2016-03-11 DIAGNOSIS — H01021 Squamous blepharitis right upper eyelid: Secondary | ICD-10-CM | POA: Diagnosis not present

## 2016-03-11 DIAGNOSIS — H2513 Age-related nuclear cataract, bilateral: Secondary | ICD-10-CM | POA: Diagnosis not present

## 2016-03-11 DIAGNOSIS — H04123 Dry eye syndrome of bilateral lacrimal glands: Secondary | ICD-10-CM | POA: Diagnosis not present

## 2016-03-11 DIAGNOSIS — H01024 Squamous blepharitis left upper eyelid: Secondary | ICD-10-CM | POA: Diagnosis not present

## 2016-03-24 DIAGNOSIS — S7002XA Contusion of left hip, initial encounter: Secondary | ICD-10-CM | POA: Diagnosis not present

## 2016-03-24 DIAGNOSIS — Z6822 Body mass index (BMI) 22.0-22.9, adult: Secondary | ICD-10-CM | POA: Diagnosis not present

## 2016-04-28 DIAGNOSIS — J4530 Mild persistent asthma, uncomplicated: Secondary | ICD-10-CM | POA: Diagnosis not present

## 2016-04-28 DIAGNOSIS — E782 Mixed hyperlipidemia: Secondary | ICD-10-CM | POA: Diagnosis not present

## 2016-04-28 DIAGNOSIS — G6289 Other specified polyneuropathies: Secondary | ICD-10-CM | POA: Diagnosis not present

## 2016-08-20 ENCOUNTER — Encounter (INDEPENDENT_AMBULATORY_CARE_PROVIDER_SITE_OTHER): Payer: Self-pay

## 2016-08-20 ENCOUNTER — Ambulatory Visit (INDEPENDENT_AMBULATORY_CARE_PROVIDER_SITE_OTHER): Payer: Medicare Other | Admitting: Neurology

## 2016-08-20 ENCOUNTER — Encounter: Payer: Self-pay | Admitting: Neurology

## 2016-08-20 VITALS — BP 135/80 | HR 76 | Ht 63.5 in | Wt 129.0 lb

## 2016-08-20 DIAGNOSIS — R202 Paresthesia of skin: Secondary | ICD-10-CM | POA: Diagnosis not present

## 2016-08-20 NOTE — Progress Notes (Signed)
Reason for visit: Foot numbness  Sharon Hood is an 83 y.o. female  History of present illness:  Sharon Hood is an 83 year old right-handed white female with a history of numbness in the balls of the feet bilaterally that started little less than one year ago. The patient has not noted any progression in her symptoms since that time. The patient has not had significant discomfort, she mainly notes the numbness sensations when she tries to flex her toes. The patient is able to rest well at night. She denies any balance changes or difficulty controlling the bowels or the bladder. She has not had any falls. She returns to this office for an evaluation.  Past Medical History:  Diagnosis Date  . Asthma     Past Surgical History:  Procedure Laterality Date  . APPENDECTOMY    . TONSILLECTOMY     83 yr old    Family History  Problem Relation Age of Onset  . Other Mother     59 yrs old  . Cancer Father   . Asthma Brother     Social history:  reports that she quit smoking about 53 years ago. She has never used smokeless tobacco. She reports that she drinks alcohol. She reports that she does not use drugs.    Allergies  Allergen Reactions  . Sulfa Antibiotics Nausea Only    Medications:  Prior to Admission medications   Medication Sig Start Date End Date Taking? Authorizing Provider  calcium carbonate (OSCAL) 1500 (600 Ca) MG TABS tablet Take by mouth.   Yes [provider]  Fluticasone-Salmeterol (ADVAIR) 100-50 MCG/DOSE AEPB Inhale into the lungs.   Yes [provider]  montelukast (SINGULAIR) 10 MG tablet Take by mouth.   Yes [provider]    ROS:  Out of a complete 14 system review of symptoms, the patient complains only of the following symptoms, and all other reviewed systems are negative.  Dry eyes Hearing loss  Blood pressure 135/80, pulse 76, height 5' 3.5" (1.613 m), weight 129 lb (58.5 kg).  Physical Exam  General: The  patient is alert and cooperative at the time of the examination.  Skin: No significant peripheral edema is noted.   Neurologic Exam  Mental status: The patient is alert and oriented x 3 at the time of the examination. The patient has apparent normal recent and remote memory, with an apparently normal attention span and concentration ability.   Cranial nerves: Facial symmetry is present. Speech is normal, no aphasia or dysarthria is noted. Extraocular movements are full. Visual fields are full.  Motor: The patient has good strength in all 4 extremities.  Sensory examination: Soft touch sensation is symmetric on the face, arms, and legs. No definite stocking pattern pinprick sensory deficit was seen in the legs.  Coordination: The patient has good finger-nose-finger and heel-to-shin bilaterally.   Gait and station: The patient has a normal gait. Tandem gait is normal. Romberg is negative. No drift is seen.  Reflexes: Deep tendon reflexes are symmetric.   Assessment/Plan:  1. Numbness of the feet  The patient has had nerve conduction studies that did not show a definite peripheral neuropathy. The patient has had ongoing persistent but static symptoms involving both feet. At this point, there is no indication for medical therapy. The patient will contact me if she believes that there is an alteration in her symptoms. She will follow-up if needed.  Jill Alexanders MD 08/20/2016 10:44 AM  [facility] [address] [place], [place] [address]  Phone [phone] Fax [phone]

## 2017-02-21 ENCOUNTER — Encounter (HOSPITAL_COMMUNITY): Payer: Self-pay | Admitting: Emergency Medicine

## 2017-02-21 ENCOUNTER — Emergency Department (HOSPITAL_COMMUNITY): Payer: No Typology Code available for payment source

## 2017-02-21 ENCOUNTER — Observation Stay (HOSPITAL_COMMUNITY)
Admission: EM | Admit: 2017-02-21 | Discharge: 2017-02-23 | Disposition: A | Discharge status: Home / Self Care | Payer: No Typology Code available for payment source | Attending: Emergency Medicine | Admitting: Emergency Medicine

## 2017-02-21 DIAGNOSIS — Z79899 Other long term (current) drug therapy: Secondary | ICD-10-CM | POA: Diagnosis not present

## 2017-02-21 DIAGNOSIS — S2220XA Unspecified fracture of sternum, initial encounter for closed fracture: Secondary | ICD-10-CM | POA: Diagnosis present

## 2017-02-21 DIAGNOSIS — J45909 Unspecified asthma, uncomplicated: Secondary | ICD-10-CM | POA: Insufficient documentation

## 2017-02-21 DIAGNOSIS — S2222XA Fracture of body of sternum, initial encounter for closed fracture: Principal | ICD-10-CM | POA: Insufficient documentation

## 2017-02-21 DIAGNOSIS — R079 Chest pain, unspecified: Secondary | ICD-10-CM | POA: Insufficient documentation

## 2017-02-21 LAB — CBC WITH DIFFERENTIAL/PLATELET
BASOS ABS: 0 10*3/uL (ref 0.0–0.1)
BASOS PCT: 0 %
Eosinophils Absolute: 0.3 10*3/uL (ref 0.0–0.7)
Eosinophils Relative: 3 %
HEMATOCRIT: 44.4 % (ref 36.0–46.0)
HEMOGLOBIN: 15.1 g/dL — AB (ref 12.0–15.0)
LYMPHS PCT: 27 %
Lymphs Abs: 2.4 10*3/uL (ref 0.7–4.0)
MCH: 30.8 pg (ref 26.0–34.0)
MCHC: 34 g/dL (ref 30.0–36.0)
MCV: 90.4 fL (ref 78.0–100.0)
MONO ABS: 0.7 10*3/uL (ref 0.1–1.0)
Monocytes Relative: 8 %
NEUTROS ABS: 5.6 10*3/uL (ref 1.7–7.7)
NEUTROS PCT: 62 %
Platelets: 321 10*3/uL (ref 150–400)
RBC: 4.91 MIL/uL (ref 3.87–5.11)
RDW: 13 % (ref 11.5–15.5)
WBC: 9 10*3/uL (ref 4.0–10.5)

## 2017-02-21 LAB — BASIC METABOLIC PANEL
ANION GAP: 9 (ref 5–15)
BUN: 9 mg/dL (ref 6–20)
CALCIUM: 9.1 mg/dL (ref 8.9–10.3)
CHLORIDE: 102 mmol/L (ref 101–111)
CO2: 24 mmol/L (ref 22–32)
Creatinine, Ser: 0.86 mg/dL (ref 0.44–1.00)
GFR calc non Af Amer: 59 mL/min — ABNORMAL LOW (ref >60)
GLUCOSE: 117 mg/dL — AB (ref 65–99)
POTASSIUM: 3.8 mmol/L (ref 3.5–5.1)
Sodium: 135 mmol/L (ref 135–145)

## 2017-02-21 MED ORDER — FENTANYL CITRATE (PF) 100 MCG/2ML IJ SOLN
50.0000 ug | Freq: Once | INTRAMUSCULAR | Status: AC
  Administered 2017-02-21: 50 ug via INTRAVENOUS
  Filled 2017-02-21: qty 2

## 2017-02-21 MED ORDER — IOPAMIDOL (ISOVUE-300) INJECTION 61%
INTRAVENOUS | Status: AC
  Administered 2017-02-21: 75 mL
  Filled 2017-02-21: qty 75

## 2017-02-21 MED ORDER — FENTANYL CITRATE (PF) 100 MCG/2ML IJ SOLN
50.0000 ug | Freq: Once | INTRAMUSCULAR | Status: AC
Start: 2017-02-21 — End: 2017-02-21
  Administered 2017-02-21: 50 ug via INTRAVENOUS
  Filled 2017-02-21: qty 2
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
## 2017-02-21 NOTE — ED Notes (Signed)
ED Provider at bedside. 

## 2017-02-21 NOTE — ED Notes (Signed)
Pt has a raised knot to her right chest adjacent to sternum.  Pt reports knot not being there PTA.

## 2017-02-21 NOTE — ED Notes (Signed)
Patient transported to CT 

## 2017-02-21 NOTE — ED Notes (Signed)
Pt given 50 mcg of fentanyl.  Upon assessment pt's O2 sats noted to be in the low 80's.  2L of O2 applied via [place] w/ an improvement in O2 sat to 98%.  Respirations are even and unlabored.  Pt is in NAD at this time.
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
## 2017-02-22 DIAGNOSIS — S2220XA Unspecified fracture of sternum, initial encounter for closed fracture: Secondary | ICD-10-CM | POA: Diagnosis present

## 2017-02-22 DIAGNOSIS — S2222XA Fracture of body of sternum, initial encounter for closed fracture: Secondary | ICD-10-CM | POA: Diagnosis not present

## 2017-02-22 LAB — CBC
HEMATOCRIT: 48.4 % — AB (ref 36.0–46.0)
HEMOGLOBIN: 16.5 g/dL — AB (ref 12.0–15.0)
MCH: 31.1 pg (ref 26.0–34.0)
MCHC: 34.1 g/dL (ref 30.0–36.0)
MCV: 91.1 fL (ref 78.0–100.0)
Platelets: 266 10*3/uL (ref 150–400)
RBC: 5.31 MIL/uL — AB (ref 3.87–5.11)
RDW: 13.3 % (ref 11.5–15.5)
WBC: 11.8 10*3/uL — AB (ref 4.0–10.5)

## 2017-02-22 LAB — BASIC METABOLIC PANEL
ANION GAP: 12 (ref 5–15)
BUN: 8 mg/dL (ref 6–20)
CHLORIDE: 100 mmol/L — AB (ref 101–111)
CO2: 22 mmol/L (ref 22–32)
Calcium: 9 mg/dL (ref 8.9–10.3)
Creatinine, Ser: 0.7 mg/dL (ref 0.44–1.00)
Glucose, Bld: 112 mg/dL — ABNORMAL HIGH (ref 65–99)
POTASSIUM: 3.8 mmol/L (ref 3.5–5.1)
SODIUM: 134 mmol/L — AB (ref 135–145)

## 2017-02-22 MED ORDER — OXYCODONE HCL 5 MG PO TABS: 5.0000 mg | ORAL_TABLET | ORAL | Status: DC | PRN

## 2017-02-22 MED ORDER — ONDANSETRON HCL 4 MG/2ML IJ SOLN: 4.0000 mg | Freq: Four times a day (QID) | INTRAMUSCULAR | Status: DC | PRN

## 2017-02-22 MED ORDER — HYDROMORPHONE HCL 1 MG/ML IJ SOLN: 1.0000 mg | INTRAMUSCULAR | Status: DC | PRN

## 2017-02-22 MED ORDER — MOMETASONE FURO-FORMOTEROL FUM 100-5 MCG/ACT IN AERO
2.0000 | INHALATION_SPRAY | Freq: Two times a day (BID) | RESPIRATORY_TRACT | Status: DC
  Administered 2017-02-22 (×2): 2 via RESPIRATORY_TRACT
  Filled 2017-02-22: qty 8.8

## 2017-02-22 MED ORDER — ENOXAPARIN SODIUM 40 MG/0.4ML ~~LOC~~ SOLN
40.0000 mg | SUBCUTANEOUS | Status: DC
  Administered 2017-02-22 – 2017-02-23 (×2): 40 mg via SUBCUTANEOUS
  Filled 2017-02-22 (×3): qty 0.4

## 2017-02-22 MED ORDER — HYDRALAZINE HCL 20 MG/ML IJ SOLN: 10.0000 mg | INTRAMUSCULAR | Status: DC | PRN

## 2017-02-22 MED ORDER — ONDANSETRON 4 MG PO TBDP
4.0000 mg | ORAL_TABLET | Freq: Four times a day (QID) | ORAL | Status: DC | PRN
  Filled 2017-02-22: qty 1

## 2017-02-22 MED ORDER — ACETAMINOPHEN 325 MG PO TABS: 650.0000 mg | ORAL_TABLET | ORAL | Status: DC | PRN

## 2017-02-22 MED ORDER — TRAMADOL HCL 50 MG PO TABS
50.0000 mg | ORAL_TABLET | Freq: Four times a day (QID) | ORAL | Status: DC | PRN
  Administered 2017-02-23: 50 mg via ORAL
  Filled 2017-02-22 (×2): qty 1

## 2017-02-22 MED ORDER — ACETAMINOPHEN 325 MG PO TABS
650.0000 mg | ORAL_TABLET | Freq: Four times a day (QID) | ORAL | Status: DC
  Administered 2017-02-22 – 2017-02-23 (×3): 650 mg via ORAL
  Filled 2017-02-22 (×6): qty 2

## 2017-02-22 MED ORDER — METHOCARBAMOL 500 MG PO TABS
500.0000 mg | ORAL_TABLET | Freq: Three times a day (TID) | ORAL | Status: DC
  Administered 2017-02-22 (×2): 500 mg via ORAL
  Filled 2017-02-22 (×3): qty 1

## 2017-02-22 MED ORDER — MONTELUKAST SODIUM 10 MG PO TABS
10.0000 mg | ORAL_TABLET | Freq: Every day | ORAL | Status: DC
  Administered 2017-02-22 – 2017-02-23 (×2): 10 mg via ORAL
  Filled 2017-02-22 (×3): qty 1

## 2017-02-22 MED ORDER — CALCIUM CARBONATE 1250 (500 CA) MG PO TABS
1250.0000 mg | ORAL_TABLET | Freq: Every day | ORAL | Status: DC
  Administered 2017-02-22 – 2017-02-23 (×2): 1250 mg via ORAL
  Filled 2017-02-22 (×3): qty 1

## 2017-02-22 MED ORDER — DEXTROSE-NACL 5-0.9 % IV SOLN
INTRAVENOUS | Status: DC
  Administered 2017-02-22: 50 mL/h via INTRAVENOUS

## 2017-02-22 NOTE — ED Notes (Signed)
Pt given instruction on how to use incentive spirometer w/ return demonstration.  Pt stated it was too painful to use however did verbalized understanding of how to use incentive spirometer.  Dr. Sabra Heck is aware.
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
## 2017-02-23 DIAGNOSIS — S2222XA Fracture of body of sternum, initial encounter for closed fracture: Secondary | ICD-10-CM | POA: Diagnosis not present

## 2017-02-23 MED ORDER — POLYETHYLENE GLYCOL 3350 17 G PO PACK
17.0000 g | PACK | Freq: Every day | ORAL | Status: DC
  Administered 2017-02-23: 17 g via ORAL
  Filled 2017-02-23: qty 1

## 2017-02-23 MED ORDER — TRAMADOL HCL 50 MG PO TABS: 50.0000 mg | ORAL_TABLET | Freq: Four times a day (QID) | ORAL | 0 refills | Status: DC | PRN

## 2017-02-23 MED ORDER — BOOST / RESOURCE BREEZE PO LIQD
1.0000 | Freq: Three times a day (TID) | ORAL | Status: DC
  Administered 2017-02-23: 1 via ORAL

## 2017-02-23 NOTE — Care Management CC44 (Signed)
Condition Code 44 Documentation Completed  Patient Details  Name: Sharon Hood MRN: [id number] Date of Birth: 18-May-1928   Condition Code 44 given:  Yes Patient signature on Condition Code 44 notice:  Yes Documentation of 2 MD's agreement:  Yes Code 44 added to claim:  Yes    Ella Bodo, RN 02/23/2017, 3:02 PM

## 2017-02-23 NOTE — Progress Notes (Signed)
Discharge teaching complete. Mes, diet, follow up appointments and activity reviewed and all questions answered. Copy of instructions and prescriptions given to patient. Patient discharged via wheelchair with friend.

## 2017-02-23 NOTE — Care Management Obs Status (Signed)
[place] NOTIFICATION   Patient Details  Name: Sharon Hood MRN: [id number] Date of Birth: 30-Mar-1929   Medicare Observation Status Notification Given:  Yes    Ella Bodo, RN 02/23/2017, 3:02 PM

## 2017-02-23 NOTE — Social Work (Signed)
CSW completed SBIRT. No intervention recommended at this time.  Elissa Hefty, LCSW Clinical Social Worker [phone]
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
## 2018-05-24 HISTORY — PX: EYE SURGERY: SHX253
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
## 2019-06-28 DIAGNOSIS — H04123 Dry eye syndrome of bilateral lacrimal glands: Secondary | ICD-10-CM | POA: Diagnosis not present

## 2019-06-28 DIAGNOSIS — H35051 Retinal neovascularization, unspecified, right eye: Secondary | ICD-10-CM | POA: Diagnosis not present

## 2019-06-28 DIAGNOSIS — Z961 Presence of intraocular lens: Secondary | ICD-10-CM | POA: Diagnosis not present

## 2019-06-28 DIAGNOSIS — H40033 Anatomical narrow angle, bilateral: Secondary | ICD-10-CM | POA: Diagnosis not present

## 2019-06-28 DIAGNOSIS — H25812 Combined forms of age-related cataract, left eye: Secondary | ICD-10-CM | POA: Diagnosis not present

## 2019-06-28 DIAGNOSIS — H0102A Squamous blepharitis right eye, upper and lower eyelids: Secondary | ICD-10-CM | POA: Diagnosis not present

## 2019-06-28 DIAGNOSIS — H0102B Squamous blepharitis left eye, upper and lower eyelids: Secondary | ICD-10-CM | POA: Diagnosis not present

## 2019-07-07 ENCOUNTER — Encounter (INDEPENDENT_AMBULATORY_CARE_PROVIDER_SITE_OTHER): Payer: Medicare Other | Admitting: Ophthalmology

## 2019-07-08 ENCOUNTER — Encounter (INDEPENDENT_AMBULATORY_CARE_PROVIDER_SITE_OTHER): Payer: Self-pay | Admitting: Ophthalmology

## 2019-07-08 ENCOUNTER — Ambulatory Visit (INDEPENDENT_AMBULATORY_CARE_PROVIDER_SITE_OTHER): Payer: Medicare PPO | Admitting: Ophthalmology

## 2019-07-08 DIAGNOSIS — H353121 Nonexudative age-related macular degeneration, left eye, early dry stage: Secondary | ICD-10-CM | POA: Diagnosis not present

## 2019-07-08 DIAGNOSIS — H3581 Retinal edema: Secondary | ICD-10-CM | POA: Diagnosis not present

## 2019-07-08 DIAGNOSIS — H25812 Combined forms of age-related cataract, left eye: Secondary | ICD-10-CM

## 2019-07-08 DIAGNOSIS — H35033 Hypertensive retinopathy, bilateral: Secondary | ICD-10-CM

## 2019-07-08 DIAGNOSIS — I10 Essential (primary) hypertension: Secondary | ICD-10-CM

## 2019-07-08 DIAGNOSIS — H353211 Exudative age-related macular degeneration, right eye, with active choroidal neovascularization: Secondary | ICD-10-CM

## 2019-07-08 DIAGNOSIS — Z961 Presence of intraocular lens: Secondary | ICD-10-CM

## 2019-07-08 MED ORDER — BEVACIZUMAB CHEMO INJECTION 1.25MG/0.05ML SYRINGE FOR KALEIDOSCOPE
1.2500 mg | INTRAVITREAL | Status: AC | PRN
Start: 2019-07-08 — End: 2019-07-08
  Administered 2019-07-08: 12:00:00 1.25 mg via INTRAVITREAL

## 2019-07-08 NOTE — Progress Notes (Signed)
[facility] Note  07/08/2019     CHIEF COMPLAINT Patient presents for Retina Evaluation   HISTORY OF PRESENT ILLNESS: Sharon Hood is a 86 y.o. female who presents to the clinic today for:   HPI    Retina Evaluation    In right eye.  Onset: Unknown.  Duration: Unknown.  Context:  distance vision and near vision.  Treatments tried include no treatments.  I, the attending physician,  performed the HPI with the patient and updated documentation appropriately.          Comments    86 y/o female pt referred by Dr. Zenia Resides for eval of CNVM OD.  Pt saw Dr. Katy Fitch very recently.  Pt had no idea anything was wrong, and was there for her CEE.  Pt feels her VA is okay OU (worse OS).  Denies pain, flashes, floaters.  Every few mos, her vision OU will randomly get blurred, and she will see temporal auras OU.  Symptoms last for about an hour, and resolve on their own.  Restasis BID OU.       Last edited by Bernarda Caffey, MD on 07/08/2019 11:06 AM. (History)     Patient states she was referred by Dr. Zenia Resides for eval of CNVM OD. Was at Dr. Zenia Resides office for routine eye exam. Vision worse OS compared to OD per patient.    Referring physician: Debbra Riding, MD [address] [place],  [place] [address]  HISTORICAL INFORMATION:   Selected notes from the MEDICAL RECORD NUMBER Referred by Dr. Zenia Resides for eval of CNVM OD.   CURRENT MEDICATIONS: No current outpatient medications on file. (Ophthalmic Drugs)   No current facility-administered medications for this visit. (Ophthalmic Drugs)   Current Outpatient Medications (Other)  Medication Sig  . calcium carbonate (OSCAL) 1500 (600 Ca) MG TABS tablet Take 1,500 mg daily with breakfast by mouth.   . Fluticasone-Salmeterol (ADVAIR) 100-50 MCG/DOSE AEPB Inhale 1 puff daily into the lungs.   . montelukast (SINGULAIR) 10 MG tablet Take 10 mg every morning by mouth.   . Naproxen Sodium 220 MG CAPS Take by  mouth.  . traMADol (ULTRAM) 50 MG tablet Take 1 tablet (50 mg total) every 6 (six) hours as needed by mouth for severe pain.   No current facility-administered medications for this visit. (Other)      REVIEW OF SYSTEMS: ROS    Positive for: Musculoskeletal, Eyes, Respiratory   Negative for: Constitutional, Gastrointestinal, Neurological, Skin, Genitourinary, HENT, Endocrine, Cardiovascular, Psychiatric, Allergic/Imm, Heme/Lymph   Last edited by Matthew Folks, COA on 07/08/2019 10:55 AM. (History)       ALLERGIES Allergies  Allergen Reactions  . Sulfa Antibiotics Nausea Only    PAST MEDICAL HISTORY Past Medical History:  Diagnosis Date  . Asthma   . Cataract    OS   Past Surgical History:  Procedure Laterality Date  . APPENDECTOMY    . CATARACT EXTRACTION Right   . EYE SURGERY Right    Cat Sx  . TONSILLECTOMY     50 yr old    FAMILY HISTORY Family History  Problem Relation Age of Onset  . Other Mother        8 yrs old  . Cancer Father   . Asthma Brother     SOCIAL HISTORY Social History   Tobacco Use  . Smoking status: Former Smoker    Quit date: 01/22/1963    Years  since quitting: 56.4  . Smokeless tobacco: Never Used  Substance Use Topics  . Alcohol use: Yes    Comment: 1-2 drinks per day  . Drug use: No         OPHTHALMIC EXAM:  Base Eye Exam    Visual Acuity (Snellen - Linear)      Right Left   Dist cc 20/40 -2 20/60 -2   Dist ph cc NI 20/40 +2   Correction: Glasses       Tonometry (Tonopen, 10:57 AM)      Right Left   Pressure 14 16       Pupils      Dark Light Shape React APD   Right 3 2 Round Brisk None   Left 3 2 Round Brisk None       Visual Fields (Counting fingers)      Left Right    Full Full       Extraocular Movement      Right Left    Full, Ortho Full, Ortho       Neuro/Psych    Oriented x3: Yes   Mood/Affect: Normal       Dilation    Both eyes: 1.0% Mydriacyl, 2.5% Phenylephrine @ 10:57 AM         Slit Lamp and Fundus Exam    Slit Lamp Exam      Right Left   Lids/Lashes Dermatochalasis - upper lid Dermatochalasis - upper lid, mild Meibomian gland dysfunction   Conjunctiva/Sclera temporal pinguecula white and quiet   Cornea arcus, 1-2+ PEE, + EGMD, well healed temporal cataract wound 1+ PEE, mild tear film debris   Anterior Chamber deep and clear Deep and quiet, narrow temporal angle   Iris round and dilated round and dilated   Lens well centered PCIOL, trace PCO 2-3 + Nuclear sclerosis with earl brunescense, 2+ Cortical   Vitreous syneresis, PVD syneresis       Fundus Exam      Right Left   Disc pink and sharp, nasal peripapillary CNV with edema and exudate pink and sharp   C/D Ratio 0.3 0.2   Macula flat, blunted foveal reflex, mild RPE mottling and cluping,no heme or edema flat, blunted foveal reflex, mild RPE mottling and clumping, no heme or edema   Vessels Vascular attenuation, Tortuous Vascular attenuation, Tortuous   Periphery attached,  mild reticular degeneration,nasal peripapillary CNV with edema and exudate attached, mild reticular degeneration        Refraction    Wearing Rx      Sphere Cylinder Axis Add   Right [place] +1.50 178 +2.75   Left +0.50 +1.25 034 +2.75   Age: Unknown   Type: PAL       Manifest Refraction      Sphere Cylinder Axis Dist VA   Right +0.25 +1.00 175 20/40-   Left [place] +0.75 033 20/40          IMAGING AND PROCEDURES  Imaging and Procedures for @TODAY @  OCT, Retina - OU - Both Eyes       Right Eye Quality was good. Central Foveal Thickness: 230. Progression has no prior data. Findings include normal foveal contour, no SRF, subretinal hyper-reflective material (NFP; f-peripapillary CNV).   Left Eye Quality was good. Central Foveal Thickness: 236. Progression has no prior data. Findings include normal foveal contour, no SRF, no IRF.   Notes *Images captured and stored on drive  Diagnosis / Impression:  OD: Exudative  ARMD-- focal PED  and overlying IRF nasal to disc - peripapillary CNV OS: NFP, no IRF/SRF, trace drusen, small PED superior to disc caught on widefield, no IRF/SRF overlying  Clinical management:  See below  Abbreviations: NFP - Normal foveal profile. CME - cystoid macular edema. PED - pigment epithelial detachment. IRF - intraretinal fluid. SRF - subretinal fluid. EZ - ellipsoid zone. ERM - epiretinal membrane. ORA - outer retinal atrophy. ORT - outer retinal tubulation. SRHM - subretinal hyper-reflective material        Intravitreal Injection, Pharmacologic Agent - OD - Right Eye       Time Out 07/08/2019. 11:36 AM. Confirmed correct patient, procedure, site, and patient consented.   Anesthesia Topical anesthesia was used. Anesthetic medications included Lidocaine 2%, Proparacaine 0.5%.   Procedure Preparation included 5% betadine to ocular surface, eyelid speculum. A (32g) needle was used.   Injection:  1.25 mg Bevacizumab (AVASTIN) SOLN   NDC: [id number], Lot: [id number] , Expiration date: 09/09/2019   Route: Intravitreal, Site: Right Eye, Waste: 0 mL  Post-op Post injection exam found visual acuity of at least counting fingers. The patient tolerated the procedure well. There were no complications. The patient received written and verbal post procedure care education.                 ASSESSMENT/PLAN:    ICD-10-CM   1. Exudative age-related macular degeneration of right eye with active choroidal neovascularization (HCC)  H35.3211 Intravitreal Injection, Pharmacologic Agent - OD - Right Eye    Bevacizumab (AVASTIN) SOLN 1.25 mg  2. Retinal edema  H35.81 OCT, Retina - OU - Both Eyes  3. Early dry stage nonexudative age-related macular degeneration of left eye  H35.3121   4. Essential hypertension  I10   5. Hypertensive retinopathy of both eyes  H35.033   6. Pseudophakia, right eye  Z96.1   7. Combined forms of age-related cataract of left eye  H25.812     1,2.  Exudative age related macular degeneration OD--peripapillary CNV  - The incidence pathology and anatomy of wet AMD discussed   - The ANCHOR, MARINA, CATT and VIEW trials discussed with patient.    - discussed treatment options including observation vs intravitreal anti-VEGF agents such as Avastin, Lucentis, Eylea.    - Risks of endophthalmitis and vascular occlusive events and atrophic changes discussed with patient  - OCT shows focal PED and overlying IRF nasal to disc--peripapillary CNV   - BCVA 20/40 and pt essentially asymptomatic  - discussed findings, diagnosis, prognosis and treatment options  - recommend IVA #1 OD today, 03-26.21  - risks and benefits discussed with patient  - patient wishes to proceed with IVA  - consent obtained  - informed consent obtained and scanned on 03.26.21  - f/u in 4 weeks DFE, OCT  3. Non-exudative ARMD OS  Recommend AREDS vitamins  Avoid tobacco products  Amsler grid for weekly vision checks.  Patient instructed to test one eye at a time.    Patient to call 04.08.21 if appearance of grid is changing (lines curved or missing) or other changes in vision are noted.   Patient educated that interventions for exudative (wet) macular degeneration work best if used urgently after changes are noted   4,5.Hypertensive retinopathy OU - discussed importance of tight BP control - monitor  6. Psuedophakia OD - IOL in good position, beautiful surgery - doing well  7. Mixed cataract OS - The symptoms of cataract, surgical options, and treatments and risks were discussed with patient. - discussed diagnosis  and progression - not yet visually significant - monitor for now  Ophthalmic Meds Ordered this visit:  Meds ordered this encounter  Medications  . Bevacizumab (AVASTIN) SOLN 1.25 mg       Return in about 4 weeks (around 08/05/2019) for DFE, OCT.  There are no Patient Instructions on file for this visit.   Explained the diagnoses, plan, and follow up  with the patient and they expressed understanding.  Patient expressed understanding of the importance of proper follow up care.   This document serves as a record of services personally performed by Gardiner Sleeper, MD, PhD. It was created on their behalf by Roselee Nova, COMT. The creation of this record is the provider's dictation and/or activities during the visit.  Electronically signed by: Roselee Nova, COMT 07/08/19 11:26 PM  Gardiner Sleeper, M.D., Ph.D. Diseases & Surgery of the Retina and [place] 07/08/2019   I have reviewed the above documentation for accuracy and completeness, and I agree with the above. Gardiner Sleeper, M.D., Ph.D. 07/08/19 11:26 PM   Abbreviations: M myopia (nearsighted); A astigmatism; H hyperopia (farsighted); P presbyopia; Mrx spectacle prescription;  CTL contact lenses; OD right eye; OS left eye; OU both eyes  XT exotropia; ET esotropia; PEK punctate epithelial keratitis; PEE punctate epithelial erosions; DES dry eye syndrome; MGD meibomian gland dysfunction; ATs artificial tears; PFAT's preservative free artificial tears; [place] nuclear sclerotic cataract; PSC posterior subcapsular cataract; ERM epi-retinal membrane; PVD posterior vitreous detachment; RD retinal detachment; DM diabetes mellitus; DR diabetic retinopathy; NPDR non-proliferative diabetic retinopathy; PDR proliferative diabetic retinopathy; CSME clinically significant macular edema; DME diabetic macular edema; dbh dot blot hemorrhages; CWS cotton wool spot; POAG primary open angle glaucoma; C/D cup-to-disc ratio; HVF humphrey visual field; GVF goldmann visual field; OCT optical coherence tomography; IOP intraocular pressure; BRVO Branch retinal vein occlusion; CRVO central retinal vein occlusion; CRAO central retinal artery occlusion; BRAO branch retinal artery occlusion; RT retinal tear; SB scleral buckle; PPV pars plana vitrectomy; VH Vitreous hemorrhage; PRP panretinal  laser photocoagulation; IVK intravitreal kenalog; VMT vitreomacular traction; MH Macular hole;  NVD neovascularization of the disc; NVE neovascularization elsewhere; AREDS age related eye disease study; ARMD age related macular degeneration; POAG primary open angle glaucoma; EBMD epithelial/anterior basement membrane dystrophy; ACIOL anterior chamber intraocular lens; IOL intraocular lens; PCIOL posterior chamber intraocular lens; Phaco/IOL phacoemulsification with intraocular lens placement; [place] photorefractive keratectomy; LASIK laser assisted in situ keratomileusis; HTN hypertension; DM diabetes mellitus; COPD chronic obstructive pulmonary disease

## 2019-07-19 ENCOUNTER — Encounter (INDEPENDENT_AMBULATORY_CARE_PROVIDER_SITE_OTHER): Payer: Self-pay | Admitting: Ophthalmology

## 2019-07-20 DIAGNOSIS — D485 Neoplasm of uncertain behavior of skin: Secondary | ICD-10-CM | POA: Diagnosis not present

## 2019-07-20 DIAGNOSIS — D2272 Melanocytic nevi of left lower limb, including hip: Secondary | ICD-10-CM | POA: Diagnosis not present

## 2019-07-20 DIAGNOSIS — L578 Other skin changes due to chronic exposure to nonionizing radiation: Secondary | ICD-10-CM | POA: Diagnosis not present

## 2019-07-20 DIAGNOSIS — Z85828 Personal history of other malignant neoplasm of skin: Secondary | ICD-10-CM | POA: Diagnosis not present

## 2019-07-20 DIAGNOSIS — Z86008 Personal history of in-situ neoplasm of other site: Secondary | ICD-10-CM | POA: Diagnosis not present

## 2019-07-20 DIAGNOSIS — L820 Inflamed seborrheic keratosis: Secondary | ICD-10-CM | POA: Diagnosis not present

## 2019-07-20 DIAGNOSIS — L570 Actinic keratosis: Secondary | ICD-10-CM | POA: Diagnosis not present

## 2019-07-20 DIAGNOSIS — L821 Other seborrheic keratosis: Secondary | ICD-10-CM | POA: Diagnosis not present

## 2019-08-02 NOTE — Progress Notes (Signed)
[facility] Note  08/05/2019     CHIEF COMPLAINT Patient presents for Retina Follow Up   HISTORY OF PRESENT ILLNESS: Sharon Hood is a 86 y.o. female who presents to the clinic today for:   HPI    Retina Follow Up    Patient presents with  Wet AMD.  In right eye.  This started weeks ago.  Severity is moderate.  Duration of weeks.  Since onset it is stable.  I, the attending physician,  performed the HPI with the patient and updated documentation appropriately.          Comments    Patient states her vision is about the same OU.  Pt denies eye pain or discomfort and denies any new or worsening floaters or fol OU.       Last edited by Bernarda Caffey, MD on 08/06/2019 11:49 PM. (History)    Patient states no problems after last exam   Referring physician: Manon Hilding, MD [place],  [place] [address]  HISTORICAL INFORMATION:   Selected notes from the MEDICAL RECORD NUMBER Referred by Dr. Zenia Resides for eval of CNVM OD.   CURRENT MEDICATIONS: No current outpatient medications on file. (Ophthalmic Drugs)   No current facility-administered medications for this visit. (Ophthalmic Drugs)   Current Outpatient Medications (Other)  Medication Sig  . calcium carbonate (OSCAL) 1500 (600 Ca) MG TABS tablet Take 1,500 mg daily with breakfast by mouth.   . Fluticasone-Salmeterol (ADVAIR) 100-50 MCG/DOSE AEPB Inhale 1 puff daily into the lungs.   . montelukast (SINGULAIR) 10 MG tablet Take 10 mg every morning by mouth.   . Naproxen Sodium 220 MG CAPS Take by mouth.  . traMADol (ULTRAM) 50 MG tablet Take 1 tablet (50 mg total) every 6 (six) hours as needed by mouth for severe pain.   No current facility-administered medications for this visit. (Other)      REVIEW OF SYSTEMS: ROS    Positive for: Musculoskeletal, Eyes, Respiratory   Negative for: Constitutional, Gastrointestinal, Neurological, Skin, Genitourinary, HENT, Endocrine,  Cardiovascular, Psychiatric, Allergic/Imm, Heme/Lymph   Last edited by Doneen Poisson on 08/05/2019  1:02 PM. (History)       ALLERGIES Allergies  Allergen Reactions  . Sulfa Antibiotics Nausea Only    PAST MEDICAL HISTORY Past Medical History:  Diagnosis Date  . Asthma   . Cataract    OS   Past Surgical History:  Procedure Laterality Date  . APPENDECTOMY    . CATARACT EXTRACTION Right   . EYE SURGERY Right    Cat Sx  . TONSILLECTOMY     34 yr old    FAMILY HISTORY Family History  Problem Relation Age of Onset  . Other Mother        73 yrs old  . Cancer Father   . Asthma Brother     SOCIAL HISTORY Social History   Tobacco Use  . Smoking status: Former Smoker    Quit date: 01/22/1963    Years since quitting: 56.5  . Smokeless tobacco: Never Used  Substance Use Topics  . Alcohol use: Yes    Comment: 1-2 drinks per day  . Drug use: No         OPHTHALMIC EXAM:  Base Eye Exam    Visual Acuity (Snellen - Linear)      Right Left   Dist cc 20/40 +1 20/40 -2   Dist ph cc NI 20/30 -2  Correction: Glasses       Tonometry (Tonopen, 1:06 PM)      Right Left   Pressure 13 16       Pupils      Dark Light Shape React APD   Right 3 2 Round Brisk 0   Left 3 2 Round Brisk 0       Visual Fields      Left Right    Full Full       Extraocular Movement      Right Left    Full Full       Neuro/Psych    Oriented x3: Yes   Mood/Affect: Normal       Dilation    Both eyes: 1.0% Mydriacyl, 2.5% Phenylephrine @ 1:06 PM        Slit Lamp and Fundus Exam    Slit Lamp Exam      Right Left   Lids/Lashes Dermatochalasis - upper lid Dermatochalasis - upper lid, mild Meibomian gland dysfunction   Conjunctiva/Sclera temporal pinguecula white and quiet   Cornea arcus, 1-2+ PEE, + EGMD, well healed temporal cataract wound 1+ PEE, mild tear film debris   Anterior Chamber deep and clear Deep and quiet, narrow temporal angle   Iris round and dilated round  and dilated   Lens well centered PCIOL, trace PCO 2-3 + Nuclear sclerosis with earl brunescense, 2+ Cortical   Vitreous syneresis, PVD syneresis, Posterior vitreous detachment       Fundus Exam      Right Left   Disc pink and sharp, nasal peripapillary CNV with edema and exudate -- improving pink and sharp   C/D Ratio 0.3 0.2   Macula flat, blunted foveal reflex, mild RPE mottling and clumping,no heme or edema flat, blunted foveal reflex, mild RPE mottling and clumping, no heme or edema   Vessels Vascular attenuation, Tortuous Vascular attenuation, Tortuous   Periphery attached, mild reticular degeneration, nasal peripapillary CNV with edema and exudate -- improving attached, mild reticular degeneration        Refraction    Wearing Rx      Sphere Cylinder Axis Add   Right [place] +1.50 178 +2.75   Left +0.50 +1.25 034 +2.75   Type: PAL          IMAGING AND PROCEDURES  Imaging and Procedures for @TODAY @  OCT, Retina - OU - Both Eyes       Right Eye Quality was good. Central Foveal Thickness: 228. Progression has improved. Findings include normal foveal contour, no SRF, subretinal hyper-reflective material (Interval improvement in nasal, peripapillary IRF).   Left Eye Quality was good. Central Foveal Thickness: 239. Progression has been stable. Findings include normal foveal contour, no SRF, no IRF.   Notes *Images captured and stored on drive  Diagnosis / Impression:  OD: Exudative ARMD -- Interval improvement in nasal, peripapillary IRF OS: NFP, no IRF/SRF, trace drusen, small PED superior to disc caught on widefield, no IRF/SRF overlying  Clinical management:  See below  Abbreviations: NFP - Normal foveal profile. CME - cystoid macular edema. PED - pigment epithelial detachment. IRF - intraretinal fluid. SRF - subretinal fluid. EZ - ellipsoid zone. ERM - epiretinal membrane. ORA - outer retinal atrophy. ORT - outer retinal tubulation. SRHM - subretinal hyper-reflective  material        Intravitreal Injection, Pharmacologic Agent - OD - Right Eye       Time Out 08/05/2019. 1:13 PM. Confirmed correct patient, procedure, site, and patient consented.  Anesthesia Topical anesthesia was used. Anesthetic medications included Lidocaine 2%, Proparacaine 0.5%.   Procedure Preparation included 5% betadine to ocular surface, eyelid speculum. A supplied needle was used.   Injection:  1.25 mg Bevacizumab (AVASTIN) SOLN   NDC: [id number], Lot: [id number] , Expiration date: 09/15/2019   Route: Intravitreal, Site: Right Eye, Waste: 0 mL  Post-op Post injection exam found visual acuity of at least counting fingers. The patient tolerated the procedure well. There were no complications. The patient received written and verbal post procedure care education.                 ASSESSMENT/PLAN:    ICD-10-CM   1. Exudative age-related macular degeneration of right eye with active choroidal neovascularization (HCC)  H35.3211 Intravitreal Injection, Pharmacologic Agent - OD - Right Eye    Bevacizumab (AVASTIN) SOLN 1.25 mg  2. Retinal edema  H35.81 OCT, Retina - OU - Both Eyes  3. Early dry stage nonexudative age-related macular degeneration of left eye  H35.3121   4. Essential hypertension  I10   5. Hypertensive retinopathy of both eyes  H35.033   6. Pseudophakia, right eye  Z96.1   7. Combined forms of age-related cataract of left eye  H25.812     1,2. Exudative age related macular degeneration OD--peripapillary CNV  - s/p IVA OD #1 (03.26.21)  - OCT shows focal PED and overlying IRF nasal to disc--peripapillary CNV -- interval improvement in IRF  - BCVA stable at 20/40; pt essentially asymptomatic  - recommend IVA #2 OD today, 04.23.21  - risks and benefits discussed with patient  - patient wishes to proceed with IVA  - consent obtained  - IVA informed consent form signed and scanned on 03.26.21  - f/u in 4 weeks DFE, OCT  3. Non-exudative ARMD  OS  - Recommend AREDS vitamins  - Avoid tobacco products  - Amsler grid for weekly vision checks.  Patient instructed to test one eye at a time.    - Patient to call 04.08.21 if appearance of grid is changing (lines curved or missing) or other changes in vision are noted.   - Patient educated that interventions for exudative (wet) macular degeneration work best if used urgently after changes are noted  4,5.Hypertensive retinopathy OU  - discussed importance of tight BP control  - monitor  6. Psuedophakia OD  - IOL in good position, beautiful surgery  - doing well  7. Mixed cataract OS  - The symptoms of cataract, surgical options, and treatments and risks were discussed with patient.  - discussed diagnosis and progression  - not yet visually significant  - monitor for now  Ophthalmic Meds Ordered this visit:  Meds ordered this encounter  Medications  . Bevacizumab (AVASTIN) SOLN 1.25 mg       Return in about 5 weeks (around 09/09/2019) for exu ARMD OD, DFE, OCT.  There are no Patient Instructions on file for this visit.   Explained the diagnoses, plan, and follow up with the patient and they expressed understanding.  Patient expressed understanding of the importance of proper follow up care.   This document serves as a record of services personally performed by 09/22/2019, MD, PhD. It was created on their behalf by Gardiner Sleeper, COMT. The creation of this record is the provider's dictation and/or activities during the visit.  Electronically signed by: Roselee Nova, COMT 08/06/19 11:55 PM  08/19/19, M.D., Ph.D. Diseases & Surgery of the Retina and Vitreous [facility]  [place]  I have reviewed the above documentation for accuracy and completeness, and I agree with the above. Gardiner Sleeper, M.D., Ph.D. 08/06/19 11:55 PM   Abbreviations: M myopia (nearsighted); A astigmatism; H hyperopia (farsighted); P presbyopia; Mrx spectacle prescription;  CTL  contact lenses; OD right eye; OS left eye; OU both eyes  XT exotropia; ET esotropia; PEK punctate epithelial keratitis; PEE punctate epithelial erosions; DES dry eye syndrome; MGD meibomian gland dysfunction; ATs artificial tears; PFAT's preservative free artificial tears; [place] nuclear sclerotic cataract; PSC posterior subcapsular cataract; ERM epi-retinal membrane; PVD posterior vitreous detachment; RD retinal detachment; DM diabetes mellitus; DR diabetic retinopathy; NPDR non-proliferative diabetic retinopathy; PDR proliferative diabetic retinopathy; CSME clinically significant macular edema; DME diabetic macular edema; dbh dot blot hemorrhages; CWS cotton wool spot; POAG primary open angle glaucoma; C/D cup-to-disc ratio; HVF humphrey visual field; GVF goldmann visual field; OCT optical coherence tomography; IOP intraocular pressure; BRVO Branch retinal vein occlusion; CRVO central retinal vein occlusion; CRAO central retinal artery occlusion; BRAO branch retinal artery occlusion; RT retinal tear; SB scleral buckle; PPV pars plana vitrectomy; VH Vitreous hemorrhage; PRP panretinal laser photocoagulation; IVK intravitreal kenalog; VMT vitreomacular traction; MH Macular hole;  NVD neovascularization of the disc; NVE neovascularization elsewhere; AREDS age related eye disease study; ARMD age related macular degeneration; POAG primary open angle glaucoma; EBMD epithelial/anterior basement membrane dystrophy; ACIOL anterior chamber intraocular lens; IOL intraocular lens; PCIOL posterior chamber intraocular lens; Phaco/IOL phacoemulsification with intraocular lens placement; [place] photorefractive keratectomy; LASIK laser assisted in situ keratomileusis; HTN hypertension; DM diabetes mellitus; COPD chronic obstructive pulmonary disease

## 2019-08-05 ENCOUNTER — Ambulatory Visit (INDEPENDENT_AMBULATORY_CARE_PROVIDER_SITE_OTHER): Payer: Medicare PPO | Admitting: Ophthalmology

## 2019-08-05 DIAGNOSIS — I10 Essential (primary) hypertension: Secondary | ICD-10-CM | POA: Diagnosis not present

## 2019-08-05 DIAGNOSIS — H3581 Retinal edema: Secondary | ICD-10-CM

## 2019-08-05 DIAGNOSIS — H353121 Nonexudative age-related macular degeneration, left eye, early dry stage: Secondary | ICD-10-CM

## 2019-08-05 DIAGNOSIS — H353211 Exudative age-related macular degeneration, right eye, with active choroidal neovascularization: Secondary | ICD-10-CM

## 2019-08-05 DIAGNOSIS — H35033 Hypertensive retinopathy, bilateral: Secondary | ICD-10-CM | POA: Diagnosis not present

## 2019-08-05 DIAGNOSIS — H25812 Combined forms of age-related cataract, left eye: Secondary | ICD-10-CM | POA: Diagnosis not present

## 2019-08-05 DIAGNOSIS — Z961 Presence of intraocular lens: Secondary | ICD-10-CM

## 2019-08-06 ENCOUNTER — Encounter (INDEPENDENT_AMBULATORY_CARE_PROVIDER_SITE_OTHER): Payer: Self-pay | Admitting: Ophthalmology

## 2019-08-06 MED ORDER — BEVACIZUMAB CHEMO INJECTION 1.25MG/0.05ML SYRINGE FOR KALEIDOSCOPE
1.2500 mg | INTRAVITREAL | Status: AC | PRN
  Administered 2019-08-06: 1.25 mg via INTRAVITREAL

## 2019-09-05 NOTE — Progress Notes (Signed)
[facility] Note  09/08/2019     CHIEF COMPLAINT Patient presents for Retina Follow Up   HISTORY OF PRESENT ILLNESS: Sharon Hood is a 86 y.o. female who presents to the clinic today for:   HPI    Retina Follow Up    Patient presents with  Wet AMD.  In right eye.  This started 5 weeks ago.  Severity is moderate.  I, the attending physician,  performed the HPI with the patient and updated documentation appropriately.          Comments    Patient here for 5 weeks retina follow up for exu ARMD OD. Patient states vision doing the same as usual. No eye pain.       Last edited by Bernarda Caffey, MD on 09/08/2019  8:56 AM. (History)    Patient states she is not having any problems with her vision   Referring physician: Manon Hilding, MD [place],  [place] [address]  HISTORICAL INFORMATION:   Selected notes from the MEDICAL RECORD NUMBER Referred by Dr. Zenia Resides for eval of CNVM OD.   CURRENT MEDICATIONS: No current outpatient medications on file. (Ophthalmic Drugs)   No current facility-administered medications for this visit. (Ophthalmic Drugs)   Current Outpatient Medications (Other)  Medication Sig  . calcium carbonate (OSCAL) 1500 (600 Ca) MG TABS tablet Take 1,500 mg daily with breakfast by mouth.   . Fluticasone-Salmeterol (ADVAIR) 100-50 MCG/DOSE AEPB Inhale 1 puff daily into the lungs.   . montelukast (SINGULAIR) 10 MG tablet Take 10 mg every morning by mouth.   . Naproxen Sodium 220 MG CAPS Take by mouth.  . traMADol (ULTRAM) 50 MG tablet Take 1 tablet (50 mg total) every 6 (six) hours as needed by mouth for severe pain.   No current facility-administered medications for this visit. (Other)      REVIEW OF SYSTEMS: ROS    Positive for: Musculoskeletal, Eyes, Respiratory   Negative for: Constitutional, Gastrointestinal, Neurological, Skin, Genitourinary, HENT, Endocrine, Cardiovascular, Psychiatric, Allergic/Imm, Heme/Lymph    Last edited by Theodore Demark, COA on 09/08/2019  8:50 AM. (History)       ALLERGIES Allergies  Allergen Reactions  . Sulfa Antibiotics Nausea Only    PAST MEDICAL HISTORY Past Medical History:  Diagnosis Date  . Asthma   . Cataract    OS   Past Surgical History:  Procedure Laterality Date  . APPENDECTOMY    . CATARACT EXTRACTION Right   . EYE SURGERY Right    Cat Sx  . TONSILLECTOMY     75 yr old    FAMILY HISTORY Family History  Problem Relation Age of Onset  . Other Mother        28 yrs old  . Cancer Father   . Asthma Brother     SOCIAL HISTORY Social History   Tobacco Use  . Smoking status: Former Smoker    Quit date: 01/22/1963    Years since quitting: 56.6  . Smokeless tobacco: Never Used  Substance Use Topics  . Alcohol use: Yes    Comment: 1-2 drinks per day  . Drug use: No         OPHTHALMIC EXAM:  Base Eye Exam    Visual Acuity (Snellen - Linear)      Right Left   Dist cc 20/40 20/40 +2   Dist ph cc 20/25 20/30 -1   Correction: Glasses  Tonometry (Tonopen, 8:47 AM)      Right Left   Pressure 12 16       Pupils      Dark Light Shape React APD   Right 3 2 Round Brisk None   Left 3 2 Round Brisk None       Visual Fields (Counting fingers)      Left Right    Full Full       Extraocular Movement      Right Left    Full, Ortho Full, Ortho       Neuro/Psych    Oriented x3: Yes   Mood/Affect: Normal       Dilation    Both eyes: 1.0% Mydriacyl, 2.5% Phenylephrine @ 8:47 AM        Slit Lamp and Fundus Exam    Slit Lamp Exam      Right Left   Lids/Lashes Dermatochalasis - upper lid Dermatochalasis - upper lid, mild Meibomian gland dysfunction   Conjunctiva/Sclera temporal pinguecula white and quiet   Cornea arcus, 1-2+ PEE, + EGMD, well healed temporal cataract wound 1+ PEE, mild tear film debris   Anterior Chamber deep and clear Deep and quiet, narrow temporal angle   Iris round and dilated round and  dilated   Lens well centered PCIOL, trace PCO 2-3 + Nuclear sclerosis with earl brunescense, 2+ Cortical   Vitreous syneresis, PVD syneresis, Posterior vitreous detachment       Fundus Exam      Right Left   Disc pink and sharp, nasal peripapillary CNV with edema and exudate -- improving pink and sharp   C/D Ratio 0.3 0.2   Macula flat, blunted foveal reflex, mild RPE mottling and clumping, no heme or edema flat, blunted foveal reflex, mild RPE mottling and clumping, no heme or edema   Vessels Vascular attenuation, Tortuous Vascular attenuation, Tortuous   Periphery attached, mild reticular degeneration, nasal peripapillary CNV with edema and exudate -- improving attached, mild reticular degeneration        Refraction    Wearing Rx      Sphere Cylinder Axis Add   Right [place] +1.50 178 +2.75   Left +0.50 +1.25 034 +2.75   Type: PAL          IMAGING AND PROCEDURES  Imaging and Procedures for @TODAY @  OCT, Retina - OU - Both Eyes       Right Eye Quality was good. Central Foveal Thickness: 230. Progression has improved. Findings include normal foveal contour, no SRF, subretinal hyper-reflective material, no IRF (Interval improvement in nasal, peripapillary IRF/IRHM overlying shallow PED/CNV).   Left Eye Quality was good. Central Foveal Thickness: 237. Progression has been stable. Findings include normal foveal contour, no SRF, no IRF.   Notes *Images captured and stored on drive  Diagnosis / Impression:  OD: Exudative ARMD -- Interval improvement in nasal, peripapillary IRF/IRHM overlying shallow PED/CNV OS: NFP, no IRF/SRF, trace drusen, small PED superior to disc caught on widefield, no IRF/SRF overlying  Clinical management:  See below  Abbreviations: NFP - Normal foveal profile. CME - cystoid macular edema. PED - pigment epithelial detachment. IRF - intraretinal fluid. SRF - subretinal fluid. EZ - ellipsoid zone. ERM - epiretinal membrane. ORA - outer retinal atrophy.  ORT - outer retinal tubulation. SRHM - subretinal hyper-reflective material        Intravitreal Injection, Pharmacologic Agent - OD - Right Eye       Time Out 09/08/2019. 9:39 AM. Confirmed correct patient,  procedure, site, and patient consented.   Anesthesia Topical anesthesia was used. Anesthetic medications included Lidocaine 2%, Proparacaine 0.5%.   Procedure Preparation included 5% betadine to ocular surface, eyelid speculum. A (32g) needle was used.   Injection:  1.25 mg Bevacizumab (AVASTIN) SOLN   NDC: [id number], Lot: [id number] , Expiration date: 09/15/2019   Route: Intravitreal, Site: Right Eye, Waste: 0 mL  Post-op Post injection exam found visual acuity of at least counting fingers. The patient tolerated the procedure well. There were no complications. The patient received written and verbal post procedure care education.                 ASSESSMENT/PLAN:    ICD-10-CM   1. Exudative age-related macular degeneration of right eye with active choroidal neovascularization (HCC)  H35.3211 Intravitreal Injection, Pharmacologic Agent - OD - Right Eye    Bevacizumab (AVASTIN) SOLN 1.25 mg    CANCELED: Intravitreal Injection, Pharmacologic Agent - OS - Left Eye  2. Retinal edema  H35.81 OCT, Retina - OU - Both Eyes  3. Early dry stage nonexudative age-related macular degeneration of left eye  H35.3121   4. Pseudophakia, right eye  Z96.1   5. Hypertensive retinopathy of both eyes  H35.033   6. Essential hypertension  I10   7. Combined forms of age-related cataract of left eye  H25.812     1,2. Exudative age related macular degeneration OD--peripapillary CNV  - s/p IVA OD #1 (03.26.21), #2 (04.23.21)  - OCT shows focal PED and overlying IRF nasal to disc--peripapillary CNV -- interval improvement in IRF  - BCVA improved to 20/25 from 20/40; pt remains essentially asymptomatic  - recommend IVA #3 OD today, 05.27.21  - risks and benefits discussed with patient  -  patient wishes to proceed with IVA  - consent obtained  - IVA informed consent form signed and scanned on 03.26.21  - f/u in 4 weeks DFE, OCT  3. Non-exudative ARMD OS  - Recommend AREDS vitamins  - Avoid tobacco products  - Amsler grid for weekly vision checks.  Patient instructed to test one eye at a time.    - Patient to call 04.08.21 if appearance of grid is changing (lines curved or missing) or other changes in vision are noted.   - Patient educated that interventions for exudative (wet) macular degeneration work best if used urgently after changes are noted  4,5.Hypertensive retinopathy OU  - discussed importance of tight BP control  - monitor  6. Pseudophakia OD  - IOL in good position, beautiful surgery  - doing well  7. Mixed cataract OS  - The symptoms of cataract, surgical options, and treatments and risks were discussed with patient.  - discussed diagnosis and progression  - not yet visually significant  - monitor for now  Ophthalmic Meds Ordered this visit:  Meds ordered this encounter  Medications  . Bevacizumab (AVASTIN) SOLN 1.25 mg       Return in about 4 weeks (around 10/06/2019) for f/u exu ARMD OD, DFE, OCT.  There are no Patient Instructions on file for this visit.   Explained the diagnoses, plan, and follow up with the patient and they expressed understanding.  Patient expressed understanding of the importance of proper follow up care.   This document serves as a record of services personally performed by 10/19/2019, MD, PhD. It was created on their behalf by Gardiner Sleeper, [place], a certified ophthalmic assistant. The creation of this record is the provider's dictation and/or activities during  the visit.    Electronically signed by: Leeann Must, COA @TODAY @ 9:56 AM   This document serves as a record of services personally performed by Gardiner Sleeper, MD, PhD. It was created on their behalf by Ernest Mallick, OA, an ophthalmic assistant. The creation  of this record is the provider's dictation and/or activities during the visit.    Electronically signed by: Ernest Mallick, OA 05.27.2021 9:56 AM   Gardiner Sleeper, M.D., Ph.D. Diseases & Surgery of the Retina and Vitreous [facility]  I have reviewed the above documentation for accuracy and completeness, and I agree with the above. Gardiner Sleeper, M.D., Ph.D. 09/08/19 9:56 AM    Abbreviations: M myopia (nearsighted); A astigmatism; H hyperopia (farsighted); P presbyopia; Mrx spectacle prescription;  CTL contact lenses; OD right eye; OS left eye; OU both eyes  XT exotropia; ET esotropia; PEK punctate epithelial keratitis; PEE punctate epithelial erosions; DES dry eye syndrome; MGD meibomian gland dysfunction; ATs artificial tears; PFAT's preservative free artificial tears; [place] nuclear sclerotic cataract; PSC posterior subcapsular cataract; ERM epi-retinal membrane; PVD posterior vitreous detachment; RD retinal detachment; DM diabetes mellitus; DR diabetic retinopathy; NPDR non-proliferative diabetic retinopathy; PDR proliferative diabetic retinopathy; CSME clinically significant macular edema; DME diabetic macular edema; dbh dot blot hemorrhages; CWS cotton wool spot; POAG primary open angle glaucoma; C/D cup-to-disc ratio; HVF humphrey visual field; GVF goldmann visual field; OCT optical coherence tomography; IOP intraocular pressure; BRVO Branch retinal vein occlusion; CRVO central retinal vein occlusion; CRAO central retinal artery occlusion; BRAO branch retinal artery occlusion; RT retinal tear; SB scleral buckle; PPV pars plana vitrectomy; VH Vitreous hemorrhage; PRP panretinal laser photocoagulation; IVK intravitreal kenalog; VMT vitreomacular traction; MH Macular hole;  NVD neovascularization of the disc; NVE neovascularization elsewhere; AREDS age related eye disease study; ARMD age related macular degeneration; POAG primary open angle glaucoma; EBMD epithelial/anterior  basement membrane dystrophy; ACIOL anterior chamber intraocular lens; IOL intraocular lens; PCIOL posterior chamber intraocular lens; Phaco/IOL phacoemulsification with intraocular lens placement; [place] photorefractive keratectomy; LASIK laser assisted in situ keratomileusis; HTN hypertension; DM diabetes mellitus; COPD chronic obstructive pulmonary disease

## 2019-09-08 ENCOUNTER — Ambulatory Visit (INDEPENDENT_AMBULATORY_CARE_PROVIDER_SITE_OTHER): Payer: Medicare PPO | Admitting: Ophthalmology

## 2019-09-08 ENCOUNTER — Encounter (INDEPENDENT_AMBULATORY_CARE_PROVIDER_SITE_OTHER): Payer: Self-pay | Admitting: Ophthalmology

## 2019-09-08 ENCOUNTER — Other Ambulatory Visit: Payer: Self-pay

## 2019-09-08 DIAGNOSIS — Z961 Presence of intraocular lens: Secondary | ICD-10-CM | POA: Diagnosis not present

## 2019-09-08 DIAGNOSIS — H353211 Exudative age-related macular degeneration, right eye, with active choroidal neovascularization: Secondary | ICD-10-CM | POA: Diagnosis not present

## 2019-09-08 DIAGNOSIS — H3581 Retinal edema: Secondary | ICD-10-CM

## 2019-09-08 DIAGNOSIS — H25812 Combined forms of age-related cataract, left eye: Secondary | ICD-10-CM | POA: Diagnosis not present

## 2019-09-08 DIAGNOSIS — H35033 Hypertensive retinopathy, bilateral: Secondary | ICD-10-CM | POA: Diagnosis not present

## 2019-09-08 DIAGNOSIS — I10 Essential (primary) hypertension: Secondary | ICD-10-CM

## 2019-09-08 DIAGNOSIS — H353121 Nonexudative age-related macular degeneration, left eye, early dry stage: Secondary | ICD-10-CM

## 2019-09-08 MED ORDER — BEVACIZUMAB CHEMO INJECTION 1.25MG/0.05ML SYRINGE FOR KALEIDOSCOPE
1.2500 mg | INTRAVITREAL | Status: AC | PRN
  Administered 2019-09-08: 1.25 mg via INTRAVITREAL

## 2019-10-18 NOTE — Progress Notes (Signed)
[facility] Note  10/19/2019     CHIEF COMPLAINT Patient presents for Retina Follow Up   HISTORY OF PRESENT ILLNESS: Sharon Hood is a 86 y.o. female who presents to the clinic today for:   HPI    Retina Follow Up    Patient presents with  Wet AMD.  In right eye.  This started weeks ago.  Severity is moderate.  Duration of weeks.  Since onset it is stable.  I, the attending physician,  performed the HPI with the patient and updated documentation appropriately.          Comments    Pt states her vision is the same OU.  Pt denies eye pain or discomfort and denies any new or worsening floaters or fol OU.       Last edited by Bernarda Caffey, MD on 10/19/2019 10:30 AM. (History)    Patient states from time to time she has "little spells" where she sees lights in her vision, she states they are sometimes months apart, last episode was 2 months ago and happened 2 days in a row, she states she has to stop whatever she is doing for 15-20 mins, she states no problems with vision today   Referring physician: Debbra Riding, MD [address] [place],  [place] [address]  HISTORICAL INFORMATION:   Selected notes from the MEDICAL RECORD NUMBER Referred by Dr. Zenia Resides for eval of CNVM OD.   CURRENT MEDICATIONS: No current outpatient medications on file. (Ophthalmic Drugs)   No current facility-administered medications for this visit. (Ophthalmic Drugs)   Current Outpatient Medications (Other)  Medication Sig   calcium carbonate (OSCAL) 1500 (600 Ca) MG TABS tablet Take 1,500 mg daily with breakfast by mouth.    Fluticasone-Salmeterol (ADVAIR) 100-50 MCG/DOSE AEPB Inhale 1 puff daily into the lungs.    montelukast (SINGULAIR) 10 MG tablet Take 10 mg every morning by mouth.    Naproxen Sodium 220 MG CAPS Take by mouth.   traMADol (ULTRAM) 50 MG tablet Take 1 tablet (50 mg total) every 6 (six) hours as needed by mouth for severe pain.   No  current facility-administered medications for this visit. (Other)      REVIEW OF SYSTEMS: ROS    Positive for: Musculoskeletal, Eyes, Respiratory   Negative for: Constitutional, Gastrointestinal, Neurological, Skin, Genitourinary, HENT, Endocrine, Cardiovascular, Psychiatric, Allergic/Imm, Heme/Lymph   Last edited by Doneen Poisson on 10/19/2019  9:22 AM. (History)       ALLERGIES Allergies  Allergen Reactions   Sulfa Antibiotics Nausea Only    PAST MEDICAL HISTORY Past Medical History:  Diagnosis Date   Asthma    Cataract    OS   Past Surgical History:  Procedure Laterality Date   APPENDECTOMY     CATARACT EXTRACTION Right    EYE SURGERY Right    Cat Sx   TONSILLECTOMY     29 yr old    FAMILY HISTORY Family History  Problem Relation Age of Onset   Other Mother        60 yrs old   Cancer Father    Asthma Brother     SOCIAL HISTORY Social History   Tobacco Use   Smoking status: Former Smoker    Quit date: 01/22/1963    Years since quitting: 56.7   Smokeless tobacco: Never Used  Substance Use Topics   Alcohol use: Yes    Comment: 1-2 drinks per day  Drug use: No         OPHTHALMIC EXAM:  Base Eye Exam    Visual Acuity (Snellen - Linear)      Right Left   Dist cc 20/25 -2 20/40 +2   Dist ph cc 20/25 20/25 -2   Correction: Glasses       Tonometry (Tonopen, 9:29 AM)      Right Left   Pressure 12 14       Pupils      Dark Light Shape React APD   Right 3 2 Round Minimal 0   Left 3 2 Round Minimal 0       Visual Fields      Left Right    Full Full       Extraocular Movement      Right Left    Full Full       Neuro/Psych    Oriented x3: Yes   Mood/Affect: Normal       Dilation    Both eyes: 1.0% Mydriacyl, 2.5% Phenylephrine @ 9:29 AM        Slit Lamp and Fundus Exam    Slit Lamp Exam      Right Left   Lids/Lashes Dermatochalasis - upper lid Dermatochalasis - upper lid, mild Meibomian gland dysfunction    Conjunctiva/Sclera temporal pinguecula white and quiet   Cornea arcus, 1+ PEE, + EBMD, well healed temporal cataract wound 1+ PEE, mild tear film debris   Anterior Chamber deep, narrow temporal angle Deep and quiet, narrow temporal angle   Iris round and dilated round and dilated   Lens well centered PCIOL, trace PCO 3 + Nuclear sclerosis with early brunescense, 2-3+ Cortical   Vitreous syneresis, PVD syneresis, Posterior vitreous detachment       Fundus Exam      Right Left   Disc pink and sharp, nasal peripapillary CNV with edema and exudate -- improving pink and sharp   C/D Ratio 0.3 0.2   Macula flat, blunted foveal reflex, mild RPE mottling and clumping, no heme or edema flat, blunted foveal reflex, mild RPE mottling and clumping, no heme or edema   Vessels Vascular attenuation Vascular attenuation, Tortuous   Periphery attached, mild reticular degeneration, nasal peripapillary CNV with edema and exudate -- improving attached, mild reticular degeneration        Refraction    Wearing Rx      Sphere Cylinder Axis Add   Right [place] +1.50 178 +2.75   Left +0.50 +1.25 034 +2.75   Type: PAL          IMAGING AND PROCEDURES  Imaging and Procedures for @TODAY @  OCT, Retina - OU - Both Eyes       Right Eye Quality was good. Central Foveal Thickness: 232. Progression has improved. Findings include normal foveal contour, no SRF, subretinal hyper-reflective material, no IRF, pigment epithelial detachment (Interval improvement in nasal, peripapillary IRF/IRHM overlying shallow PED/CNV).   Left Eye Quality was good. Central Foveal Thickness: 235. Progression has been stable. Findings include normal foveal contour, no SRF, no IRF.   Notes *Images captured and stored on drive  Diagnosis / Impression:  OD: Exudative ARMD -- Interval improvement in nasal, peripapillary IRF/IRHM overlying shallow PED/CNV OS: NFP, no IRF/SRF, trace drusen, small PED superior to disc caught on  widefield, no IRF/SRF overlying - stable to slightly improved  Clinical management:  See below  Abbreviations: NFP - Normal foveal profile. CME - cystoid macular edema. PED - pigment epithelial  detachment. IRF - intraretinal fluid. SRF - subretinal fluid. EZ - ellipsoid zone. ERM - epiretinal membrane. ORA - outer retinal atrophy. ORT - outer retinal tubulation. SRHM - subretinal hyper-reflective material        Intravitreal Injection, Pharmacologic Agent - OD - Right Eye       Time Out 10/19/2019. 10:16 AM. Confirmed correct patient, procedure, site, and patient consented.   Anesthesia Topical anesthesia was used. Anesthetic medications included Lidocaine 2%, Proparacaine 0.5%.   Procedure Preparation included 5% betadine to ocular surface, eyelid speculum. A (32g) needle was used.   Injection:  1.25 mg Bevacizumab (AVASTIN) SOLN   NDC: 66063-016-01, Lot: [id number] , Expiration date: 12/07/2019   Route: Intravitreal, Site: Right Eye, Waste: 0 mg  Post-op Post injection exam found visual acuity of at least counting fingers. The patient tolerated the procedure well. There were no complications. The patient received written and verbal post procedure care education.                 ASSESSMENT/PLAN:    ICD-10-CM   1. Exudative age-related macular degeneration of right eye with active choroidal neovascularization (HCC)  H35.3211 Intravitreal Injection, Pharmacologic Agent - OD - Right Eye    Bevacizumab (AVASTIN) SOLN 1.25 mg  2. Retinal edema  H35.81 OCT, Retina - OU - Both Eyes  3. Early dry stage nonexudative age-related macular degeneration of left eye  H35.3121   4. Pseudophakia, right eye  Z96.1   5. Hypertensive retinopathy of both eyes  H35.033   6. Essential hypertension  I10   7. Combined forms of age-related cataract of left eye  H25.812     1,2. Exudative age related macular degeneration OD--peripapillary CNV  - s/p IVA OD #1 (03.26.21), #2 (04.23.21), #3  (05.27.21)  - OCT shows focal PED and overlying IRF nasal to disc--peripapillary CNV -- IRF improving  - BCVA stable at 20/25; pt remains essentially asymptomatic  - recommend IVA #4 OD today, 07.06.21 for persistent IRF from nasal peripapillary CNV  - risks and benefits discussed with patient  - patient wishes to proceed with IVA  - consent obtained  - IVA informed consent form signed and scanned on 03.26.21  - f/u in 6 weeks DFE, OCT - pt wishes to transfer care to [facility] due to proximity and transportation issues  - will make referral to transfer retinal care to   3. Non-exudative ARMD OS  - Recommend AREDS vitamins  - Avoid tobacco products  - Amsler grid for weekly vision checks.  Patient instructed to test one eye at a time.    - Patient to call us if appearance of grid is changing (lines curved or missing) or other changes in vision are noted.   - Patient educated that interventions for exudative (wet) macular degeneration work best if used urgently after changes are noted  4,5.Hypertensive retinopathy OU  - discussed importance of tight BP control  - monitor  6. Pseudophakia OD  - IOL in good position, beautiful surgery  - doing well  7. Mixed cataract OS  - The symptoms of cataract, surgical options, and treatments and risks were discussed with patient.  - discussed diagnosis and progression   Ophthalmic Meds Ordered this visit:  Meds ordered this encounter  Medications   Bevacizumab (AVASTIN) SOLN 1.25 mg       Return if symptoms worsen or fail to improve.  There are no Patient Instructions on file for this visit.   Explained the diagnoses, plan, and  follow up with the patient and they expressed understanding.  Patient expressed understanding of the importance of proper follow up care.   This document serves as a record of services personally performed by Gardiner Sleeper, MD, PhD. It was created on their behalf by Roselee Nova, COMT. The creation  of this record is the provider's dictation and/or activities during the visit.  Electronically signed by: Roselee Nova, COMT 10/19/19 12:08 PM  This document serves as a record of services personally performed by Gardiner Sleeper, MD, PhD. It was created on their behalf by San Jetty. Owens Shark, COT, an ophthalmic technician. The creation of this record is the provider's dictation and/or activities during the visit.    Electronically signed by: San Jetty. Owens Shark, [place] 07.07.2021 12:08 PM   Gardiner Sleeper, M.D., Ph.D. Diseases & Surgery of the Retina and Vitreous [facility]  I have reviewed the above documentation for accuracy and completeness, and I agree with the above. Gardiner Sleeper, M.D., Ph.D. 10/19/19 12:08 PM   Abbreviations: M myopia (nearsighted); A astigmatism; H hyperopia (farsighted); P presbyopia; Mrx spectacle prescription;  CTL contact lenses; OD right eye; OS left eye; OU both eyes  XT exotropia; ET esotropia; PEK punctate epithelial keratitis; PEE punctate epithelial erosions; DES dry eye syndrome; MGD meibomian gland dysfunction; ATs artificial tears; PFAT's preservative free artificial tears; [place] nuclear sclerotic cataract; PSC posterior subcapsular cataract; ERM epi-retinal membrane; PVD posterior vitreous detachment; RD retinal detachment; DM diabetes mellitus; DR diabetic retinopathy; NPDR non-proliferative diabetic retinopathy; PDR proliferative diabetic retinopathy; CSME clinically significant macular edema; DME diabetic macular edema; dbh dot blot hemorrhages; CWS cotton wool spot; POAG primary open angle glaucoma; C/D cup-to-disc ratio; HVF humphrey visual field; GVF goldmann visual field; OCT optical coherence tomography; IOP intraocular pressure; BRVO Branch retinal vein occlusion; CRVO central retinal vein occlusion; CRAO central retinal artery occlusion; BRAO branch retinal artery occlusion; RT retinal tear; SB scleral buckle; PPV pars plana vitrectomy;  VH Vitreous hemorrhage; PRP panretinal laser photocoagulation; IVK intravitreal kenalog; VMT vitreomacular traction; MH Macular hole;  NVD neovascularization of the disc; NVE neovascularization elsewhere; AREDS age related eye disease study; ARMD age related macular degeneration; POAG primary open angle glaucoma; EBMD epithelial/anterior basement membrane dystrophy; ACIOL anterior chamber intraocular lens; IOL intraocular lens; PCIOL posterior chamber intraocular lens; Phaco/IOL phacoemulsification with intraocular lens placement; [place] photorefractive keratectomy; LASIK laser assisted in situ keratomileusis; HTN hypertension; DM diabetes mellitus; COPD chronic obstructive pulmonary disease

## 2019-10-19 ENCOUNTER — Other Ambulatory Visit: Payer: Self-pay

## 2019-10-19 ENCOUNTER — Encounter (INDEPENDENT_AMBULATORY_CARE_PROVIDER_SITE_OTHER): Payer: Self-pay | Admitting: Ophthalmology

## 2019-10-19 ENCOUNTER — Ambulatory Visit (INDEPENDENT_AMBULATORY_CARE_PROVIDER_SITE_OTHER): Payer: Medicare PPO | Admitting: Ophthalmology

## 2019-10-19 DIAGNOSIS — H353211 Exudative age-related macular degeneration, right eye, with active choroidal neovascularization: Secondary | ICD-10-CM

## 2019-10-19 DIAGNOSIS — H25812 Combined forms of age-related cataract, left eye: Secondary | ICD-10-CM

## 2019-10-19 DIAGNOSIS — Z961 Presence of intraocular lens: Secondary | ICD-10-CM

## 2019-10-19 DIAGNOSIS — I10 Essential (primary) hypertension: Secondary | ICD-10-CM

## 2019-10-19 DIAGNOSIS — H3581 Retinal edema: Secondary | ICD-10-CM

## 2019-10-19 DIAGNOSIS — H353121 Nonexudative age-related macular degeneration, left eye, early dry stage: Secondary | ICD-10-CM

## 2019-10-19 DIAGNOSIS — H35033 Hypertensive retinopathy, bilateral: Secondary | ICD-10-CM | POA: Diagnosis not present

## 2019-10-19 MED ORDER — BEVACIZUMAB CHEMO INJECTION 1.25MG/0.05ML SYRINGE FOR KALEIDOSCOPE
1.2500 mg | INTRAVITREAL | Status: AC | PRN
  Administered 2019-10-19: 1.25 mg via INTRAVITREAL

## 2019-11-29 DIAGNOSIS — H353211 Exudative age-related macular degeneration, right eye, with active choroidal neovascularization: Secondary | ICD-10-CM | POA: Diagnosis not present

## 2020-01-24 DIAGNOSIS — H353211 Exudative age-related macular degeneration, right eye, with active choroidal neovascularization: Secondary | ICD-10-CM | POA: Diagnosis not present

## 2020-02-17 DIAGNOSIS — L578 Other skin changes due to chronic exposure to nonionizing radiation: Secondary | ICD-10-CM | POA: Diagnosis not present

## 2020-02-17 DIAGNOSIS — D225 Melanocytic nevi of trunk: Secondary | ICD-10-CM | POA: Diagnosis not present

## 2020-02-17 DIAGNOSIS — L821 Other seborrheic keratosis: Secondary | ICD-10-CM | POA: Diagnosis not present

## 2020-02-17 DIAGNOSIS — L570 Actinic keratosis: Secondary | ICD-10-CM | POA: Diagnosis not present

## 2020-02-17 DIAGNOSIS — Z86008 Personal history of in-situ neoplasm of other site: Secondary | ICD-10-CM | POA: Diagnosis not present

## 2020-02-17 DIAGNOSIS — Z85828 Personal history of other malignant neoplasm of skin: Secondary | ICD-10-CM | POA: Diagnosis not present

## 2020-02-17 DIAGNOSIS — D2272 Melanocytic nevi of left lower limb, including hip: Secondary | ICD-10-CM | POA: Diagnosis not present

## 2020-03-13 DIAGNOSIS — N183 Chronic kidney disease, stage 3 unspecified: Secondary | ICD-10-CM | POA: Diagnosis not present

## 2020-03-13 DIAGNOSIS — R634 Abnormal weight loss: Secondary | ICD-10-CM | POA: Diagnosis not present

## 2020-03-13 DIAGNOSIS — Z0001 Encounter for general adult medical examination with abnormal findings: Secondary | ICD-10-CM | POA: Diagnosis not present

## 2020-03-13 DIAGNOSIS — E782 Mixed hyperlipidemia: Secondary | ICD-10-CM | POA: Diagnosis not present

## 2020-03-16 DIAGNOSIS — M1711 Unilateral primary osteoarthritis, right knee: Secondary | ICD-10-CM | POA: Diagnosis not present

## 2020-03-16 DIAGNOSIS — J45909 Unspecified asthma, uncomplicated: Secondary | ICD-10-CM | POA: Diagnosis not present

## 2020-03-16 DIAGNOSIS — Z6820 Body mass index (BMI) 20.0-20.9, adult: Secondary | ICD-10-CM | POA: Diagnosis not present

## 2020-03-29 DIAGNOSIS — H353211 Exudative age-related macular degeneration, right eye, with active choroidal neovascularization: Secondary | ICD-10-CM | POA: Diagnosis not present

## 2020-03-29 DIAGNOSIS — H2512 Age-related nuclear cataract, left eye: Secondary | ICD-10-CM | POA: Diagnosis not present

## 2020-04-02 DIAGNOSIS — J45909 Unspecified asthma, uncomplicated: Secondary | ICD-10-CM | POA: Diagnosis not present

## 2020-04-02 DIAGNOSIS — H2512 Age-related nuclear cataract, left eye: Secondary | ICD-10-CM | POA: Diagnosis not present

## 2020-04-04 ENCOUNTER — Encounter: Payer: Self-pay | Admitting: Ophthalmology

## 2020-04-04 ENCOUNTER — Other Ambulatory Visit: Payer: Self-pay

## 2020-04-05 ENCOUNTER — Encounter: Payer: Self-pay | Admitting: Anesthesiology

## 2020-04-12 NOTE — Discharge Instructions (Signed)

## 2020-04-13 ENCOUNTER — Other Ambulatory Visit
Admission: RE | Admit: 2020-04-13 | Discharge: 2020-04-13 | Disposition: A | Discharge status: Home / Self Care | Payer: Medicare PPO | Source: Ambulatory Visit | Attending: Ophthalmology | Admitting: Ophthalmology

## 2020-04-13 ENCOUNTER — Other Ambulatory Visit: Payer: Self-pay

## 2020-04-13 DIAGNOSIS — Z01812 Encounter for preprocedural laboratory examination: Secondary | ICD-10-CM | POA: Insufficient documentation

## 2020-04-13 DIAGNOSIS — Z20822 Contact with and (suspected) exposure to covid-19: Secondary | ICD-10-CM | POA: Insufficient documentation

## 2020-04-13 LAB — SARS CORONAVIRUS 2 (TAT 6-24 HRS): SARS Coronavirus 2: NEGATIVE

## 2020-04-17 ENCOUNTER — Ambulatory Visit: Admission: RE | Admit: 2020-04-17 | Payer: Medicare PPO | Source: Home / Self Care | Admitting: Ophthalmology

## 2020-04-17 HISTORY — DX: Dizziness and giddiness: R42

## 2020-04-17 HISTORY — DX: Essential (primary) hypertension: I10

## 2020-04-17 HISTORY — DX: Presence of external hearing-aid: Z97.4

## 2020-04-17 SURGERY — PHACOEMULSIFICATION, CATARACT, WITH IOL INSERTION
Anesthesia: Topical | Laterality: Left

## 2020-04-23 DIAGNOSIS — H353211 Exudative age-related macular degeneration, right eye, with active choroidal neovascularization: Secondary | ICD-10-CM | POA: Diagnosis not present

## 2020-05-23 DIAGNOSIS — J45909 Unspecified asthma, uncomplicated: Secondary | ICD-10-CM | POA: Diagnosis not present

## 2020-05-29 ENCOUNTER — Encounter: Payer: Self-pay | Admitting: Ophthalmology

## 2020-05-29 ENCOUNTER — Other Ambulatory Visit: Payer: Self-pay

## 2020-06-01 ENCOUNTER — Other Ambulatory Visit
Admission: RE | Admit: 2020-06-01 | Discharge: 2020-06-01 | Disposition: A | Discharge status: Home / Self Care | Payer: Medicare PPO | Source: Ambulatory Visit | Attending: Ophthalmology | Admitting: Ophthalmology

## 2020-06-01 ENCOUNTER — Other Ambulatory Visit: Payer: Self-pay

## 2020-06-01 DIAGNOSIS — Z01812 Encounter for preprocedural laboratory examination: Secondary | ICD-10-CM | POA: Insufficient documentation

## 2020-06-01 DIAGNOSIS — Z20822 Contact with and (suspected) exposure to covid-19: Secondary | ICD-10-CM | POA: Insufficient documentation

## 2020-06-01 LAB — SARS CORONAVIRUS 2 (TAT 6-24 HRS): SARS Coronavirus 2: NEGATIVE

## 2020-06-01 NOTE — Discharge Instructions (Signed)

## 2020-06-05 ENCOUNTER — Other Ambulatory Visit: Payer: Self-pay

## 2020-06-05 ENCOUNTER — Encounter: Payer: Self-pay | Admitting: Ophthalmology

## 2020-06-05 ENCOUNTER — Encounter
Admission: RE | Disposition: A | Discharge status: Home / Self Care | Payer: Self-pay | Source: Home / Self Care | Attending: Ophthalmology

## 2020-06-05 ENCOUNTER — Ambulatory Visit: Payer: Medicare PPO | Admitting: Anesthesiology

## 2020-06-05 ENCOUNTER — Ambulatory Visit
Admission: RE | Admit: 2020-06-05 | Discharge: 2020-06-05 | Disposition: A | Discharge status: Home / Self Care | Payer: Medicare PPO | Attending: Ophthalmology | Admitting: Ophthalmology

## 2020-06-05 DIAGNOSIS — Z7951 Long term (current) use of inhaled steroids: Secondary | ICD-10-CM | POA: Diagnosis not present

## 2020-06-05 DIAGNOSIS — Z87891 Personal history of nicotine dependence: Secondary | ICD-10-CM | POA: Diagnosis not present

## 2020-06-05 DIAGNOSIS — H2512 Age-related nuclear cataract, left eye: Secondary | ICD-10-CM | POA: Insufficient documentation

## 2020-06-05 DIAGNOSIS — Z882 Allergy status to sulfonamides status: Secondary | ICD-10-CM | POA: Diagnosis not present

## 2020-06-05 DIAGNOSIS — H25812 Combined forms of age-related cataract, left eye: Secondary | ICD-10-CM | POA: Diagnosis not present

## 2020-06-05 HISTORY — PX: CATARACT EXTRACTION W/PHACO: SHX586

## 2020-06-05 SURGERY — PHACOEMULSIFICATION, CATARACT, WITH IOL INSERTION
Anesthesia: Monitor Anesthesia Care | Site: Eye | Laterality: Left

## 2020-06-05 MED ORDER — MIDAZOLAM HCL 2 MG/2ML IJ SOLN
INTRAMUSCULAR | Status: DC | PRN
  Administered 2020-06-05: .5 mg via INTRAVENOUS

## 2020-06-05 MED ORDER — LIDOCAINE HCL (PF) 2 % IJ SOLN
INTRAOCULAR | Status: DC | PRN
  Administered 2020-06-05: 1 mL

## 2020-06-05 MED ORDER — FENTANYL CITRATE (PF) 100 MCG/2ML IJ SOLN
INTRAMUSCULAR | Status: DC | PRN
  Administered 2020-06-05: 50 ug via INTRAVENOUS

## 2020-06-05 MED ORDER — TETRACAINE HCL 0.5 % OP SOLN
1.0000 [drp] | OPHTHALMIC | Status: DC | PRN
  Administered 2020-06-05 (×3): 1 [drp] via OPHTHALMIC

## 2020-06-05 MED ORDER — EPINEPHRINE PF 1 MG/ML IJ SOLN
INTRAOCULAR | Status: DC | PRN
  Administered 2020-06-05: 51 mL via OPHTHALMIC

## 2020-06-05 MED ORDER — MOXIFLOXACIN HCL 0.5 % OP SOLN
OPHTHALMIC | Status: DC | PRN
  Administered 2020-06-05: 0.2 mL via OPHTHALMIC

## 2020-06-05 MED ORDER — NA CHONDROIT SULF-NA HYALURON 40-17 MG/ML IO SOLN
INTRAOCULAR | Status: DC | PRN
  Administered 2020-06-05: 1 mL via INTRAOCULAR

## 2020-06-05 MED ORDER — ARMC OPHTHALMIC DILATING DROPS
1.0000 "application " | OPHTHALMIC | Status: DC | PRN
  Administered 2020-06-05 (×3): 1 via OPHTHALMIC

## 2020-06-05 MED ORDER — LACTATED RINGERS IV SOLN: INTRAVENOUS | Status: DC

## 2020-06-05 MED ORDER — BRIMONIDINE TARTRATE-TIMOLOL 0.2-0.5 % OP SOLN
OPHTHALMIC | Status: DC | PRN
  Administered 2020-06-05: 1 [drp] via OPHTHALMIC

## 2020-06-05 SURGICAL SUPPLY — 19 items
CANNULA ANT/CHMB 27G (MISCELLANEOUS) ×2 IMPLANT
CANNULA ANT/CHMB 27GA (MISCELLANEOUS) ×4 IMPLANT
GLOVE SURG LX 8.0 MICRO (GLOVE) ×1
GLOVE SURG LX STRL 8.0 MICRO (GLOVE) ×1 IMPLANT
GLOVE SURG TRIUMPH 8.0 PF LTX (GLOVE) ×2 IMPLANT
GOWN STRL REUS W/ TWL LRG LVL3 (GOWN DISPOSABLE) ×2 IMPLANT
GOWN STRL REUS W/TWL LRG LVL3 (GOWN DISPOSABLE) ×4
LENS IOL ACRYSOF IQ 21.0 (Intraocular Lens) ×1 IMPLANT
MARKER SKIN DUAL TIP RULER LAB (MISCELLANEOUS) ×2 IMPLANT
NDL FILTER BLUNT 18X1 1/2 (NEEDLE) ×1 IMPLANT
NEEDLE FILTER BLUNT 18X 1/2SAF (NEEDLE) ×1
NEEDLE FILTER BLUNT 18X1 1/2 (NEEDLE) ×1 IMPLANT
PACK EYE AFTER SURG (MISCELLANEOUS) ×2 IMPLANT
PACK OPTHALMIC (MISCELLANEOUS) ×2 IMPLANT
PACK PORFILIO (MISCELLANEOUS) ×2 IMPLANT
SYR 3ML LL SCALE MARK (SYRINGE) ×2 IMPLANT
SYR TB 1ML LUER SLIP (SYRINGE) ×2 IMPLANT
WATER STERILE IRR 250ML POUR (IV SOLUTION) ×2 IMPLANT
WIPE NON LINTING 3.25X3.25 (MISCELLANEOUS) ×2 IMPLANT

## 2020-06-05 NOTE — Anesthesia Postprocedure Evaluation (Signed)
Anesthesia Post Note  Patient: Sharon Hood  Procedure(s) Performed: CATARACT EXTRACTION PHACO AND INTRAOCULAR LENS PLACEMENT (IOC) LEFT 8.23 00:44.3 (Left Eye)     Patient location during evaluation: PACU Anesthesia Type: MAC Level of consciousness: awake and alert Pain management: pain level controlled Vital Signs Assessment: post-procedure vital signs reviewed and stable Respiratory status: nonlabored ventilation and spontaneous breathing Cardiovascular status: blood pressure returned to baseline Postop Assessment: no apparent nausea or vomiting Anesthetic complications: no   No complications documented.  Orlander Norwood [employer]

## 2020-06-05 NOTE — Transfer of Care (Signed)
Immediate Anesthesia Transfer of Care Note  Patient: Sharon Hood  Procedure(s) Performed: CATARACT EXTRACTION PHACO AND INTRAOCULAR LENS PLACEMENT (IOC) LEFT 8.23 00:44.3 (Left Eye)  Patient Location: PACU  Anesthesia Type: MAC  Level of Consciousness: awake, alert  and patient cooperative  Airway and Oxygen Therapy: Patient Spontanous Breathing and Patient connected to supplemental oxygen  Post-op Assessment: Post-op Vital signs reviewed, Patient's Cardiovascular Status Stable, Respiratory Function Stable, Patent Airway and No signs of Nausea or vomiting  Post-op Vital Signs: Reviewed and stable  Complications: No complications documented.

## 2020-06-05 NOTE — Anesthesia Preprocedure Evaluation (Signed)
Anesthesia Evaluation  Patient identified by MRN, date of birth, ID band Patient awake    Reviewed: Allergy & Precautions, NPO status , Patient's Chart, lab work & pertinent test results  Airway Mallampati: II  TM Distance: >3 FB Neck ROM: Full    Dental no notable dental hx.    Pulmonary asthma , former smoker,    Pulmonary exam normal        Cardiovascular hypertension, Normal cardiovascular exam     Neuro/Psych negative neurological ROS  negative psych ROS   GI/Hepatic negative GI ROS, Neg liver ROS,   Endo/Other  negative endocrine ROS  Renal/GU negative Renal ROS     Musculoskeletal   Abdominal Normal abdominal exam  (+)   Peds  Hematology negative hematology ROS (+)   Anesthesia Other Findings   Reproductive/Obstetrics                             Anesthesia Physical Anesthesia Plan  ASA: II  Anesthesia Plan: MAC   Post-op Pain Management:    Induction: Intravenous  PONV Risk Score and Plan: 2 and TIVA, Midazolam and Treatment may vary due to age or medical condition  Airway Management Planned: Natural Airway  Additional Equipment: None  Intra-op Plan:   Post-operative Plan:   Informed Consent: I have reviewed the patients History and Physical, chart, labs and discussed the procedure including the risks, benefits and alternatives for the proposed anesthesia with the patient or authorized representative who has indicated his/her understanding and acceptance.     Dental advisory given  Plan Discussed with: CRNA  Anesthesia Plan Comments:         Anesthesia Quick Evaluation

## 2020-06-05 NOTE — H&P (Signed)
[facility]   Primary Care Physician:  Pcp, No Ophthalmologist: Dr. George Ina  Pre-Procedure History & Physical: HPI:  Sharon Hood is a 87 y.o. female here for cataract surgery.   Past Medical History:  Diagnosis Date  . Asthma   . Cataract    OS  . Hypertension   . Vertigo    2 episodes.  Most recent approx 2019  . Wears hearing aid in both ears     Past Surgical History:  Procedure Laterality Date  . APPENDECTOMY    . CATARACT EXTRACTION Right   . EYE SURGERY Right 05/24/2018   Cat Sx  . TONSILLECTOMY     44 yr old    Prior to Admission medications   Medication Sig Start Date End Date Taking? Authorizing Provider  albuterol (VENTOLIN HFA) 108 (90 Base) MCG/ACT inhaler Inhale into the lungs every 6 (six) hours as needed for wheezing or shortness of breath.   Yes [provider]  calcium carbonate (OSCAL) 1500 (600 Ca) MG TABS tablet Take 1,500 mg daily with breakfast by mouth.    Yes [provider]  Fluticasone-Salmeterol (ADVAIR) 100-50 MCG/DOSE AEPB Inhale 1 puff daily into the lungs.    Yes [provider]  montelukast (SINGULAIR) 10 MG tablet Take 10 mg every morning by mouth.    Yes [provider]  Naproxen Sodium 220 MG CAPS Take by mouth.   Yes [provider]  triamterene-hydrochlorothiazide (MAXZIDE-25) 37.5-25 MG tablet Take 1 tablet by mouth daily.   Yes [provider]    Allergies as of 04/19/2020 - Review Complete 04/04/2020  Allergen Reaction Noted  . Sulfa antibiotics Nausea Only 03/02/2013    Family History  Problem Relation Age of Onset  . Other Mother        85 yrs old  . Cancer Father   . Asthma Brother     Social History   Socioeconomic History  . Marital status: Widowed    Spouse name: Not on file  . Number of children: 2  . Years of education: College  . Highest education level: Not on file  Occupational History  . Occupation: Retired  Tobacco Use  . Smoking  status: Former Smoker    Quit date: 01/22/1963    Years since quitting: 57.4  . Smokeless tobacco: Never Used  Vaping Use  . Vaping Use: Never used  Substance and Sexual Activity  . Alcohol use: Yes    Comment: 1-2 drinks per day  . Drug use: No  . Sexual activity: Not on file  Other Topics Concern  . Not on file  Social History Narrative   Lives at home alone   Right-handed   Caffeine: about 4 cups per day   Social Determinants of Health   Financial Resource Strain: Not on file  Food Insecurity: Not on file  Transportation Needs: Not on file  Physical Activity: Not on file  Stress: Not on file  Social Connections: Not on file  Intimate Partner Violence: Not on file    Review of Systems: See HPI, otherwise negative ROS  Physical Exam: BP (!) 164/71   Pulse 66   Temp 97.6 F (36.4 C) (Temporal)   Ht 5\' 3"  (1.6 m)   Wt 52.2 kg   SpO2 100%   BMI 20.37 kg/m  General:   Alert,  pleasant and cooperative in NAD Head:  Normocephalic and atraumatic. Respiratory:  Normal work of breathing.  Impression/Plan: Sharon Hood is here for  cataract surgery.  Risks, benefits, limitations, and alternatives regarding cataract surgery have been reviewed with the patient.  Questions have been answered.  All parties agreeable.   Birder Robson, MD  06/05/2020, 10:33 AM

## 2020-06-05 NOTE — Anesthesia Procedure Notes (Signed)
Procedure Name: MAC Date/Time: 06/05/2020 10:40 AM Performed by: Cameron Ali, CRNA Pre-anesthesia Checklist: Patient identified, Emergency Drugs available, Suction available, Timeout performed and Patient being monitored Patient Re-evaluated:Patient Re-evaluated prior to induction Oxygen Delivery Method: Nasal cannula Placement Confirmation: positive ETCO2

## 2020-06-05 NOTE — Op Note (Signed)
PREOPERATIVE DIAGNOSIS:  Nuclear sclerotic cataract of the left eye.   POSTOPERATIVE DIAGNOSIS:  Nuclear sclerotic cataract of the left eye.   OPERATIVE PROCEDURE:@   SURGEON:  Birder Robson, MD.   ANESTHESIA:  Anesthesiologist: Ardeth Sportsman, MD CRNA: Cameron Ali, CRNA  1.      Managed anesthesia care. 2.     0.60ml of Shugarcaine was instilled following the paracentesis   COMPLICATIONS:  None.   TECHNIQUE:   Stop and chop   DESCRIPTION OF PROCEDURE:  The patient was examined and consented in the preoperative holding area where the aforementioned topical anesthesia was applied to the left eye and then brought back to the Operating Room where the left eye was prepped and draped in the usual sterile ophthalmic fashion and a lid speculum was placed. A paracentesis was created with the side port blade and the anterior chamber was filled with viscoelastic. A near clear corneal incision was performed with the steel keratome. A continuous curvilinear capsulorrhexis was performed with a cystotome followed by the capsulorrhexis forceps. Hydrodissection and hydrodelineation were carried out with BSS on a blunt cannula. The lens was removed in a stop and chop  technique and the remaining cortical material was removed with the irrigation-aspiration handpiece. The capsular bag was inflated with viscoelastic and the Technis [id number] lens was placed in the capsular bag without complication. The remaining viscoelastic was removed from the eye with the irrigation-aspiration handpiece. The wounds were hydrated. The anterior chamber was flushed with BSS and the eye was inflated to physiologic pressure. 0.24ml Vigamox was placed in the anterior chamber. The wounds were found to be water tight. The eye was dressed with Combigan. The patient was given protective glasses to wear throughout the day and a shield with which to sleep tonight. The patient was also given drops with which to begin a drop regimen today and will  follow-up with me in one day. Implant Name Type Inv. Item Serial No. Manufacturer Lot No. LRB No. Used Action  LENS IOL ACRYSOF IQ 21.0 - [id number] Intraocular Lens LENS IOL ACRYSOF IQ 21.0 [id number] ALCON  Left 1 Implanted    Procedure(s): CATARACT EXTRACTION PHACO AND INTRAOCULAR LENS PLACEMENT (IOC) LEFT 8.23 00:44.3 (Left)  Electronically signed: Birder Robson 06/05/2020 10:58 AM

## 2020-06-06 ENCOUNTER — Encounter: Payer: Self-pay | Admitting: Ophthalmology

## 2020-06-11 ENCOUNTER — Telehealth: Payer: Self-pay

## 2020-06-11 NOTE — Telephone Encounter (Signed)
If she is asking, I would take her on (but she needs to know I plan to retire in about 3 years). There is an on campus clinic with Sherrie Mustache NP that I think would also take her as a new patient (they will be taking over for me when I retire)

## 2020-06-11 NOTE — Telephone Encounter (Signed)
Sharon Hood called in she was referred by Sharon. Bradley Ferris her son in law told her about Dr. Silvio Pate and she lives in [place]

## 2020-06-11 NOTE — Telephone Encounter (Signed)
I assume she is asking if Dr Silvio Pate will take her as a new pt.

## 2020-06-11 NOTE — Telephone Encounter (Signed)
Not for independent living---only memory care, health care and assisted living

## 2020-06-11 NOTE — Telephone Encounter (Signed)
Patient said she'll check about seeing Sharon Hood. She thought Dr.Letvak was on campus.

## 2020-07-10 ENCOUNTER — Other Ambulatory Visit: Payer: Self-pay

## 2020-07-10 ENCOUNTER — Telehealth: Payer: Self-pay | Admitting: *Deleted

## 2020-07-10 ENCOUNTER — Ambulatory Visit: Payer: Medicare PPO | Admitting: Nurse Practitioner

## 2020-07-10 ENCOUNTER — Encounter: Payer: Self-pay | Admitting: Nurse Practitioner

## 2020-07-10 VITALS — BP 110/80 | HR 66 | Temp 98.6°F | Ht 63.0 in | Wt 117.5 lb

## 2020-07-10 DIAGNOSIS — M159 Polyosteoarthritis, unspecified: Secondary | ICD-10-CM

## 2020-07-10 DIAGNOSIS — M8949 Other hypertrophic osteoarthropathy, multiple sites: Secondary | ICD-10-CM

## 2020-07-10 DIAGNOSIS — H9113 Presbycusis, bilateral: Secondary | ICD-10-CM | POA: Diagnosis not present

## 2020-07-10 DIAGNOSIS — M810 Age-related osteoporosis without current pathological fracture: Secondary | ICD-10-CM

## 2020-07-10 DIAGNOSIS — J4520 Mild intermittent asthma, uncomplicated: Secondary | ICD-10-CM | POA: Diagnosis not present

## 2020-07-10 MED ORDER — KETOCONAZOLE 2 % EX SHAM: 1.0000 "application " | MEDICATED_SHAMPOO | CUTANEOUS | 0 refills | Status: DC

## 2020-07-10 NOTE — Progress Notes (Signed)
Careteam: Patient Care Team: Lauree Chandler, NP as PCP - General (Geriatric Medicine)  Advanced Directive information Does Patient Have a Medical Advance Directive?: Yes, Type of Advance Directive: [place];Living will, Does patient want to make changes to medical advance directive?: No - Patient declined  Allergies  Allergen Reactions  . Sulfa Antibiotics Nausea Only    Chief Complaint  Patient presents with  . Establish Care    New patient to establish care.Discuss need for Covid vaccine,Tetanus/Tdap, Pneumonia vaccine and Flu vaccine. Patient has been having problems with right knee. Patient would like to discuss seborrhea on scalp     HPI: Patient is a 87 y.o. female seen in today at the [facility] to establish care.  Previously being followed in [place]. Has been at [facility] for 1 year in December.    asthma- rarely will use albuterol. Reports asthma has been under good control the last 25 years. Also on fluticosone-salmeterol only uses daily at bedtime and on montelukast 10 mg daily.   On calcium for supplement.   Has arthritis in her finger, right shoulder and right knee. Has not taken any medication for this.   HOH- wears hearing aides  Had one episode of blacking out 3 years ago. Has not reoccurred. Was hospitalized for 3 days and nothing was found.   Biggest problem is the seborrhea on her scalp. Using a small amount of topical shampoo (unsure of the name) very itchy.   Right index finger with significant arthritis. Reports her finger hurts whenever she uses it. No pain if nothing doing anything.  10/10 when she is trying to use it.  Shoulder hurts but still able to use and move but still knows it is there  Right knee pain- ongoing for a few years. Hurts when she stands up or lifts her legs to put on shoes. Able to walk on a flat surface without pain.  Certain movements cause pain to be 7/10.  Had xrays done.    Eats a lot of  salt otherwise food "tasteless"  Dry eyes- uses artifical tears  Review of Systems:  Review of Systems  Constitutional: Negative for chills, fever and weight loss.  HENT: Positive for hearing loss. Negative for tinnitus.   Respiratory: Negative for cough, sputum production and shortness of breath.   Cardiovascular: Negative for chest pain, palpitations and leg swelling.  Gastrointestinal: Negative for abdominal pain, constipation, diarrhea and heartburn.  Genitourinary: Negative for dysuria, frequency and urgency.  Musculoskeletal: Positive for joint pain. Negative for back pain, falls and myalgias.  Skin: Positive for itching and rash.  Neurological: Negative for dizziness and headaches.  Psychiatric/Behavioral: Negative for depression and memory loss. The patient does not have insomnia.     Past Medical History:  Diagnosis Date  . Arthritis   . Asthma   . Black-out (not amnesia) 2016  . Cataract    OS  . Hypertension   . Vertigo    2 episodes.  Most recent approx 2019  . Wears hearing aid in both ears    Past Surgical History:  Procedure Laterality Date  . APPENDECTOMY    . CATARACT EXTRACTION Right   . CATARACT EXTRACTION W/PHACO Left 06/05/2020   Procedure: CATARACT EXTRACTION PHACO AND INTRAOCULAR LENS PLACEMENT (IOC) LEFT 8.23 00:44.3;  Surgeon: Birder Robson, MD;  Location: [facility];  Service: Ophthalmology;  Laterality: Left;  . EYE SURGERY Right 05/24/2018   Cat Sx  . TONSILLECTOMY  35 yr old   Social History:   reports that she quit smoking about 57 years ago. She has never used smokeless tobacco. She reports current alcohol use. She reports that she does not use drugs.  Family History  Problem Relation Age of Onset  . Other Mother        51 yrs old  . Cancer Father   . Asthma Brother     Medications: Patient's Medications  New Prescriptions   No medications on file  Previous Medications   ALBUTEROL (VENTOLIN HFA) 108 (90 BASE)  MCG/ACT INHALER    Inhale into the lungs every 6 (six) hours as needed for wheezing or shortness of breath.   CALCIUM CARBONATE (OSCAL) 1500 (600 CA) MG TABS TABLET    Take 1,500 mg daily with breakfast by mouth.    FLUTICASONE-SALMETEROL (WIXELA INHUB) 250-50 MCG/DOSE AEPB    Inhale 1 puff into the lungs 2 (two) times daily.   MONTELUKAST (SINGULAIR) 10 MG TABLET    Take 10 mg every morning by mouth.    TRIAMTERENE-HYDROCHLOROTHIAZIDE (MAXZIDE-25) 37.5-25 MG TABLET    Take 1 tablet by mouth daily.  Modified Medications   No medications on file  Discontinued Medications   FLUTICASONE-SALMETEROL (ADVAIR) 100-50 MCG/DOSE AEPB    Inhale 1 puff daily into the lungs.     Physical Exam:  Vitals:   07/10/20 1104  BP: 110/80  Pulse: 66  Temp: 98.6 F (37 C)  SpO2: 92%  Weight: 117 lb 8 oz (53.3 kg)  Height: 5\' 3"  (1.6 m)   Body mass index is 20.81 kg/m. Wt Readings from Last 3 Encounters:  07/10/20 117 lb 8 oz (53.3 kg)  06/05/20 115 lb (52.2 kg)  08/20/16 129 lb (58.5 kg)    Physical Exam Constitutional:      General: She is not in acute distress.    Appearance: She is well-developed. She is not diaphoretic.  HENT:     Head: Normocephalic and atraumatic.     Mouth/Throat:     Pharynx: No oropharyngeal exudate.  Eyes:     Conjunctiva/sclera: Conjunctivae normal.     Pupils: Pupils are equal, round, and reactive to light.  Cardiovascular:     Rate and Rhythm: Normal rate and regular rhythm.     Heart sounds: Normal heart sounds.  Pulmonary:     Effort: Pulmonary effort is normal.     Breath sounds: Normal breath sounds.  Abdominal:     General: Bowel sounds are normal.     Palpations: Abdomen is soft.  Musculoskeletal:        General: No tenderness.     Right hand: Swelling (noted to right hand first finger around joints-mild) present.     Cervical back: Normal range of motion and neck supple.     Right knee: Crepitus present.     Left knee: Crepitus present.  Skin:     General: Skin is warm and dry.  Neurological:     Mental Status: She is alert and oriented to person, place, and time.    Labs reviewed: Basic Metabolic Panel: No results for input(s): NA, K, CL, CO2, GLUCOSE, BUN, CREATININE, CALCIUM, MG, PHOS, TSH in the last 8760 hours. Liver Function Tests: No results for input(s): AST, ALT, ALKPHOS, BILITOT, PROT, ALBUMIN in the last 8760 hours. No results for input(s): LIPASE, AMYLASE in the last 8760 hours. No results for input(s): AMMONIA in the last 8760 hours. CBC: No results for input(s): WBC, NEUTROABS, HGB, HCT, MCV,  PLT in the last 8760 hours. Lipid Panel: No results for input(s): CHOL, HDL, LDLCALC, TRIG, CHOLHDL, LDLDIRECT in the last 8760 hours. TSH: No results for input(s): TSH in the last 8760 hours. A1C: No results found for: HGBA1C   Assessment/Plan 1. Primary osteoarthritis involving multiple joints -ongoing, very active and still able to maintain activity level but has some pain in knee, finger and shoulders.  -recommend tylenol 500 mg by mouth every 6 hours as needed pain.  -can use ice and muscle rub as needed - follow up if pain worsens.   2. Presbycusis of both ears -continues with hearing aides  3. Mild intermittent asthma without complication -reports symptoms are controlled. Faint wheezing noted on exam. Educated to take fluticasone-salmeterol twice daily as prescribed.   4. Age-related osteoporosis without current pathological fracture -discussed bone density to follow up, last dexa in 2001 revealing osteoporosis. She will think about getting another scan. She stated in the past she had decided not to get another scan.  -encouraged cal and vit d with weight bearing activity.   Next appt: 6 months, sooner if needed  Labs before appt C[facility] Schedler K. Itmann, [facility] [phone]

## 2020-07-10 NOTE — Patient Instructions (Addendum)
To call office and let us know what you are taking for your scalp.   Add tylenol 500 mg by mouth daily to morning regimen- you can take every 6 hours as needed for pain.  Use muscle rub as needed to hands (bengay, biofreeze, aspercream (with lidocaine), icyhot)  Recommended to take calcium and vit d to take separate  To get Vit D 2000 units daily  Calcium 600 mg twice daily- to take twice daily to help with absorption   Think about bone density- to look for thinning or weakness of the bone.

## 2020-07-10 NOTE — Telephone Encounter (Signed)
Patient called and stated that she was seen at the [facility] this morning and you asked her to call back with the name of medication she is using Topically.  It is Clobetasol.

## 2020-07-10 NOTE — Telephone Encounter (Signed)
She can try ketoconazole shampoo, twice a week for 8 weeks then as needed to see if this helps with symptoms better.  To stop current treatment

## 2020-07-13 DIAGNOSIS — Z961 Presence of intraocular lens: Secondary | ICD-10-CM | POA: Diagnosis not present

## 2020-07-23 DIAGNOSIS — H353211 Exudative age-related macular degeneration, right eye, with active choroidal neovascularization: Secondary | ICD-10-CM | POA: Diagnosis not present

## 2020-08-09 ENCOUNTER — Ambulatory Visit: Payer: Medicare PPO | Admitting: Nurse Practitioner

## 2020-08-09 ENCOUNTER — Other Ambulatory Visit: Payer: Self-pay

## 2020-08-09 VITALS — BP 116/70 | HR 78 | Temp 98.1°F

## 2020-08-09 DIAGNOSIS — M8949 Other hypertrophic osteoarthropathy, multiple sites: Secondary | ICD-10-CM

## 2020-08-09 DIAGNOSIS — M159 Polyosteoarthritis, unspecified: Secondary | ICD-10-CM

## 2020-08-09 DIAGNOSIS — M810 Age-related osteoporosis without current pathological fracture: Secondary | ICD-10-CM | POA: Diagnosis not present

## 2020-08-09 DIAGNOSIS — J4520 Mild intermittent asthma, uncomplicated: Secondary | ICD-10-CM

## 2020-08-09 DIAGNOSIS — R609 Edema, unspecified: Secondary | ICD-10-CM

## 2020-08-09 NOTE — Patient Instructions (Addendum)
To take calcium 600 mg by mouth twice daily and then get Vitamin d 2000 units daily   Decrease sodium (salt) in your diet Can wear compression hose or compression socks- on during the day and off before bed.  In the afternoon for 45 mins-1 hour raise feet above the level of the heart.  Making sure you are getting enough protein in your diet.   Follow up Monday morning May 2nd 7-7:15 at the clinic for blood work.     Chronic Venous Insufficiency Chronic venous insufficiency is a condition where the leg veins cannot effectively pump blood from the legs to the heart. This happens when the vein walls are either stretched, weakened, or damaged, or when the valves inside the vein are damaged. With the right treatment, you should be able to continue with an active life. This condition is also called venous stasis. What are the causes? Common causes of this condition include:  High blood pressure inside the veins (venous hypertension).  Sitting or standing too long, causing increased blood pressure in the leg veins.  A blood clot that blocks blood flow in a vein (deep vein thrombosis, DVT).  Inflammation of a vein (phlebitis) that causes a blood clot to form.  Tumors in the pelvis that cause blood to back up. What increases the risk? The following factors may make you more likely to develop this condition:  Having a family history of this condition.  Obesity.  Pregnancy.  Living without enough regular physical activity or exercise (sedentary lifestyle).  Smoking.  Having a job that requires long periods of standing or sitting in one place.  Being a certain age. Women in their 26s and 45s and men in their 67s are more likely to develop this condition. What are the signs or symptoms? Symptoms of this condition include:  Veins that are enlarged, bulging, or twisted (varicose veins).  Skin breakdown or ulcers.  Reddened skin or dark discoloration of skin on the leg between the  knee and ankle.  Brown, smooth, tight, and painful skin just above the ankle, usually on the inside of the leg (lipodermatosclerosis).  Swelling of the legs. How is this diagnosed? This condition may be diagnosed based on:  Your medical history.  A physical exam.  Tests, such as: ? A procedure that creates an image of a blood vessel and nearby organs and provides information about blood flow through the blood vessel (duplex ultrasound). ? A procedure that tests blood flow (plethysmography). ? A procedure that looks at the veins using X-ray and dye (venogram). How is this treated? The goals of treatment are to help you return to an active life and to minimize pain or disability. Treatment depends on the severity of your condition, and it may include:  Wearing compression stockings. These can help relieve symptoms and help prevent your condition from getting worse. However, they do not cure the condition.  Sclerotherapy. This procedure involves an injection of a solution that shrinks damaged veins.  Surgery. This may involve: ? Removing a diseased vein (vein stripping). ? Cutting off blood flow through the vein (laser ablation surgery). ? Repairing or reconstructing a valve within the affected vein.   Follow these instructions at home:  Wear compression stockings as told by your health care provider. These stockings help to prevent blood clots and reduce swelling in your legs.  Take over-the-counter and prescription medicines only as told by your health care provider.  Stay active by exercising, walking, or doing different activities.  Ask your health care provider what activities are safe for you and how much exercise you need.  Drink enough fluid to keep your urine pale yellow.  Do not use any products that contain nicotine or tobacco, such as cigarettes, e-cigarettes, and chewing tobacco. If you need help quitting, ask your health care provider.  Keep all follow-up visits as  told by your health care provider. This is important.      Contact a health care provider if you:  Have redness, swelling, or more pain in the affected area.  See a red streak or line that goes up or down from the affected area.  Have skin breakdown or skin loss in the affected area, even if the breakdown is small.  Get an injury in the affected area. Get help right away if:  You get an injury and an open wound in the affected area.  You have: ? Severe pain that does not get better with medicine. ? Sudden numbness or weakness in the foot or ankle below the affected area. ? Trouble moving your foot or ankle. ? A fever. ? Worse or persistent symptoms. ? Chest pain. ? Shortness of breath. Summary  Chronic venous insufficiency is a condition where the leg veins cannot effectively pump blood from the legs to the heart.  Chronic venous insufficiency occurs when the vein walls become stretched, weakened, or damaged, or when valves within the vein are damaged.  Treatment depends on how severe your condition is. It often involves wearing compression stockings and may involve having a procedure.  Make sure you stay active by exercising, walking, or doing different activities. Ask your health care provider what activities are safe for you and how much exercise you need. This information is not intended to replace advice given to you by your health care provider. Make sure you discuss any questions you have with your health care provider. Document Revised: 12/22/2017 Document Reviewed: 12/22/2017 Elsevier Patient Education  2021 [employer].

## 2020-08-09 NOTE — Progress Notes (Signed)
Careteam: Patient Care Team: Lauree Chandler, NP as PCP - General (Geriatric Medicine)  Advanced Directive information    Allergies  Allergen Reactions  . Sulfa Antibiotics Nausea Only    No chief complaint on file.    HPI: Patient is a 87 y.o. female seen in today at the [facility] for right leg swelling.  Near normal first thing in the morning but within an hour foot and ankle are swollen (mostly ankle)  Unsure how long ankle has been swollen.  It has been like this for months.  No injury  No pain.  Worried because "it looks dangerous" Is not red Loves to eat salt.  Active most the day.   Asthma- much better on her fluticasone-salmerol twice daily    Review of Systems:  Review of Systems  Constitutional: Negative for chills, fever and weight loss.  HENT: Positive for hearing loss.   Respiratory: Negative for cough, sputum production and shortness of breath.   Cardiovascular: Negative for chest pain, palpitations and leg swelling.       Ankle swelling, right worse than left  Gastrointestinal: Negative for abdominal pain, constipation, diarrhea and heartburn.  Genitourinary: Negative for dysuria, frequency and urgency.  Musculoskeletal: Negative for back pain, falls, joint pain and myalgias.  Skin: Negative.   Neurological: Negative for dizziness and headaches.    Past Medical History:  Diagnosis Date  . Arthritis   . Asthma   . Black-out (not amnesia) 2016  . Cataract    OS  . Vertigo    2 episodes.  Most recent approx 2019  . Wears hearing aid in both ears    Past Surgical History:  Procedure Laterality Date  . APPENDECTOMY    . CATARACT EXTRACTION Right   . CATARACT EXTRACTION W/PHACO Left 06/05/2020   Procedure: CATARACT EXTRACTION PHACO AND INTRAOCULAR LENS PLACEMENT (IOC) LEFT 8.23 00:44.3;  Surgeon: Birder Robson, MD;  Location: [facility];  Service: Ophthalmology;  Laterality: Left;  . EYE SURGERY Right 05/24/2018   Cat  Sx  . TONSILLECTOMY     69 yr old   Social History:   reports that she quit smoking about 57 years ago. She has never used smokeless tobacco. She reports current alcohol use. She reports that she does not use drugs.  Family History  Problem Relation Age of Onset  . Other Mother        20 yrs old  . Cancer Father   . Asthma Brother     Medications: Patient's Medications  New Prescriptions   No medications on file  Previous Medications   ALBUTEROL (VENTOLIN HFA) 108 (90 BASE) MCG/ACT INHALER    Inhale into the lungs every 6 (six) hours as needed for wheezing or shortness of breath.   CALCIUM CARBONATE (OSCAL) 1500 (600 CA) MG TABS TABLET    Take 1,500 mg daily with breakfast by mouth.    FLUTICASONE-SALMETEROL (WIXELA INHUB) 250-50 MCG/DOSE AEPB    Inhale 1 puff into the lungs 2 (two) times daily.   KETOCONAZOLE (NIZORAL) 2 % SHAMPOO    Apply 1 application topically 2 (two) times a week.   MONTELUKAST (SINGULAIR) 10 MG TABLET    Take 10 mg every morning by mouth.   Modified Medications   No medications on file  Discontinued Medications   No medications on file    Physical Exam:  There were no vitals filed for this visit. There is no height or weight on file to calculate BMI.  Wt Readings from Last 3 Encounters:  07/10/20 117 lb 8 oz (53.3 kg)  06/05/20 115 lb (52.2 kg)  08/20/16 129 lb (58.5 kg)    Physical Exam Constitutional:      General: She is not in acute distress.    Appearance: She is well-developed. She is not diaphoretic.  HENT:     Head: Normocephalic and atraumatic.     Mouth/Throat:     Pharynx: No oropharyngeal exudate.  Eyes:     Conjunctiva/sclera: Conjunctivae normal.     Pupils: Pupils are equal, round, and reactive to light.  Cardiovascular:     Rate and Rhythm: Normal rate and regular rhythm.     Heart sounds: Normal heart sounds.  Pulmonary:     Effort: Pulmonary effort is normal.     Breath sounds: Normal breath sounds.  Abdominal:      General: Bowel sounds are normal.     Palpations: Abdomen is soft.  Musculoskeletal:        General: No tenderness.     Cervical back: Normal range of motion and neck supple.     Right lower leg: Edema (2+ around ankle) present.     Left lower leg: Edema (1+ to ankle) present.  Skin:    General: Skin is warm and dry.  Neurological:     Mental Status: She is alert and oriented to person, place, and time.     Labs reviewed: Basic Metabolic Panel: No results for input(s): NA, K, CL, CO2, GLUCOSE, BUN, CREATININE, CALCIUM, MG, PHOS, TSH in the last 8760 hours. Liver Function Tests: No results for input(s): AST, ALT, ALKPHOS, BILITOT, PROT, ALBUMIN in the last 8760 hours. No results for input(s): LIPASE, AMYLASE in the last 8760 hours. No results for input(s): AMMONIA in the last 8760 hours. CBC: No results for input(s): WBC, NEUTROABS, HGB, HCT, MCV, PLT in the last 8760 hours. Lipid Panel: No results for input(s): CHOL, HDL, LDLCALC, TRIG, CHOLHDL, LDLDIRECT in the last 8760 hours. TSH: No results for input(s): TSH in the last 8760 hours. A1C: No results found for: HGBA1C   Assessment/Plan 1. Mild intermittent asthma without complication -improved with use of advair twice daily  2. Primary osteoarthritis involving multiple joints -stable at this time, OA may be playing a role in the ankle edema as well.   3. Age-related osteoporosis without current pathological fracture -educated to take calcium 600 mg by mouth twice daily and then get Vitamin d 2000 units daily   4. Asymmetric edema of both lower extremities Education provided on chronic venous insufficiency and ways to improve this.  She is not sure she will limit or reduce salt at this time, recommended to reduce -does not like compression hose or socks, but recommended to use daily -recommended proper nutrition  -elevate legs above level of heart for ~1 hour in the afternoon.  Next appt: 02/05/2021 as scheduled, will  go ahead and get scheduled lab work next week.  Carlos American. [place], [facility] [phone]

## 2020-08-13 ENCOUNTER — Encounter: Payer: Self-pay | Admitting: *Deleted

## 2020-08-13 DIAGNOSIS — M810 Age-related osteoporosis without current pathological fracture: Secondary | ICD-10-CM | POA: Diagnosis not present

## 2020-08-13 DIAGNOSIS — M8949 Other hypertrophic osteoarthropathy, multiple sites: Secondary | ICD-10-CM | POA: Diagnosis not present

## 2020-08-13 LAB — CBC AND DIFFERENTIAL
HCT: 43 (ref 36–46)
Hemoglobin: 14.1 (ref 12.0–16.0)
Platelets: 349 (ref 150–399)
WBC: 6.6

## 2020-08-13 LAB — HEPATIC FUNCTION PANEL
ALT: 25 (ref 7–35)
AST: 41 — AB (ref 13–35)
Alkaline Phosphatase: 77 (ref 25–125)

## 2020-08-13 LAB — BASIC METABOLIC PANEL
BUN: 10 (ref 4–21)
CO2: 31 — AB (ref 13–22)
Chloride: 100 (ref 99–108)
Creatinine: 0.7 (ref 0.5–1.1)
Glucose: 73
Potassium: 4.5 (ref 3.4–5.3)
Sodium: 134 — AB (ref 137–147)

## 2020-08-13 LAB — CBC: RBC: 4.75 (ref 3.87–5.11)

## 2020-08-28 ENCOUNTER — Other Ambulatory Visit: Payer: Self-pay | Admitting: Nurse Practitioner

## 2020-08-30 ENCOUNTER — Telehealth: Payer: Self-pay | Admitting: *Deleted

## 2020-08-30 NOTE — Telephone Encounter (Signed)
Patient called requesting her lab results that were done 17 days ago. Stated that she hasn't heard back from them.  Labs are under Media dated 08/13/2020. Please Advise. Forwarded to Amy due to Janett Billow out of office.

## 2020-08-30 NOTE — Telephone Encounter (Signed)
Electrolytes within normal limit. AST- liver enzyme elevated. Recommend rechecking labs next follow up.

## 2020-08-30 NOTE — Telephone Encounter (Signed)
Patient notified and agreed.   Patient wants to know about swelling ankles she was see for. Mikael Spray her a sheet about causes. Patient wants to know which one of those you think she has. What is causing her problem.   Wants Sharon Hood to address and understands she is out till Monday.

## 2020-08-31 NOTE — Telephone Encounter (Signed)
Advanced age and prolonged sitting can be reasons for ankle edema. Recommend elevating legs in recliner a few hours a day. Recommend low sodium diet as well.

## 2020-09-03 NOTE — Telephone Encounter (Signed)
Agree with Amy's recommendations.

## 2020-09-03 NOTE — Telephone Encounter (Signed)
LMOM to return call.

## 2020-09-04 NOTE — Telephone Encounter (Signed)
Patient notified and agreed.  

## 2020-09-07 ENCOUNTER — Emergency Department
Admission: EM | Admit: 2020-09-07 | Discharge: 2020-09-07 | Disposition: A | Discharge status: Home / Self Care | Payer: Medicare PPO | Attending: Emergency Medicine | Admitting: Emergency Medicine

## 2020-09-07 ENCOUNTER — Other Ambulatory Visit: Payer: Self-pay

## 2020-09-07 DIAGNOSIS — J45909 Unspecified asthma, uncomplicated: Secondary | ICD-10-CM | POA: Insufficient documentation

## 2020-09-07 DIAGNOSIS — W228XXA Striking against or struck by other objects, initial encounter: Secondary | ICD-10-CM | POA: Insufficient documentation

## 2020-09-07 DIAGNOSIS — Z87891 Personal history of nicotine dependence: Secondary | ICD-10-CM | POA: Insufficient documentation

## 2020-09-07 DIAGNOSIS — Z7951 Long term (current) use of inhaled steroids: Secondary | ICD-10-CM | POA: Diagnosis not present

## 2020-09-07 DIAGNOSIS — S91012A Laceration without foreign body, left ankle, initial encounter: Secondary | ICD-10-CM | POA: Diagnosis not present

## 2020-09-07 DIAGNOSIS — S99912A Unspecified injury of left ankle, initial encounter: Secondary | ICD-10-CM | POA: Diagnosis present

## 2020-09-07 DIAGNOSIS — Z23 Encounter for immunization: Secondary | ICD-10-CM | POA: Diagnosis not present

## 2020-09-07 DIAGNOSIS — S81812A Laceration without foreign body, left lower leg, initial encounter: Secondary | ICD-10-CM

## 2020-09-07 MED ORDER — CEPHALEXIN 500 MG PO CAPS: 500.0000 mg | ORAL_CAPSULE | Freq: Three times a day (TID) | ORAL | 0 refills | Status: AC

## 2020-09-07 MED ORDER — TETANUS-DIPHTH-ACELL PERTUSSIS 5-2.5-18.5 LF-MCG/0.5 IM SUSY
0.5000 mL | PREFILLED_SYRINGE | Freq: Once | INTRAMUSCULAR | Status: AC
  Administered 2020-09-07: 0.5 mL via INTRAMUSCULAR
  Filled 2020-09-07: qty 0.5

## 2020-09-07 MED ORDER — LIDOCAINE HCL 1 % IJ SOLN
10.0000 mL | Freq: Once | INTRAMUSCULAR | Status: AC
  Administered 2020-09-07: 10 mL
  Filled 2020-09-07: qty 10

## 2020-09-07 MED ORDER — CEPHALEXIN 500 MG PO CAPS: 500.0000 mg | ORAL_CAPSULE | Freq: Three times a day (TID) | ORAL | 0 refills | Status: DC

## 2020-09-07 NOTE — ED Notes (Signed)
See triage note  Presents with left leg laceration  States she was standing at the car  Unsure of what she hit

## 2020-09-07 NOTE — Discharge Instructions (Addendum)
Take Keflex three times daily for the next seven days.  Have sutures removed in seven days.

## 2020-09-07 NOTE — ED Provider Notes (Signed)
[facility]-EMERGENCY DEPARTMENT  ____________________________________________  Time seen: Approximately 4:04 PM  I have reviewed the triage vital signs and the nursing notes.   HISTORY  Chief Complaint Laceration   Historian Patient    HPI Sharon Hood is a 87 y.o. female presents to the emergency department with a 6 cm laceration deep to underlying adipose tissue.  Patient sustained laceration while trying to get in the passenger seat of her nephew's car is here about to take a trip to [facility].  Patient is unsure of her last tetanus shot.  Clean dressing was applied but no other alleviating measures were attempted.   Past Medical History:  Diagnosis Date  . Arthritis   . Asthma   . Black-out (not amnesia) 2016  . Cataract    OS  . Vertigo    2 episodes.  Most recent approx 2019  . Wears hearing aid in both ears      Immunizations up to date:  Yes.     Past Medical History:  Diagnosis Date  . Arthritis   . Asthma   . Black-out (not amnesia) 2016  . Cataract    OS  . Vertigo    2 episodes.  Most recent approx 2019  . Wears hearing aid in both ears     Patient Active Problem List   Diagnosis Date Noted  . Sternal fracture 02/22/2017  . Paresthesia 01/22/2016    Past Surgical History:  Procedure Laterality Date  . APPENDECTOMY    . CATARACT EXTRACTION Right   . CATARACT EXTRACTION W/PHACO Left 06/05/2020   Procedure: CATARACT EXTRACTION PHACO AND INTRAOCULAR LENS PLACEMENT (IOC) LEFT 8.23 00:44.3;  Surgeon: Birder Robson, MD;  Location: [facility];  Service: Ophthalmology;  Laterality: Left;  . EYE SURGERY Right 05/24/2018   Cat Sx  . TONSILLECTOMY     27 yr old    Prior to Admission medications   Medication Sig Start Date End Date Taking? Authorizing Provider  albuterol (VENTOLIN HFA) 108 (90 Base) MCG/ACT inhaler Inhale into the lungs every 6 (six) hours as needed for wheezing or shortness of breath.    [provider]  calcium carbonate (OSCAL) 1500 (600 Ca) MG TABS tablet Take 1,500 mg daily with breakfast by mouth.     [provider]  cephALEXin (KEFLEX) 500 MG capsule Take 1 capsule (500 mg total) by mouth 3 (three) times daily for 7 days. 09/07/20 09/14/20  Lannie Fields, PA-C  Fluticasone-Salmeterol (ADVAIR) 250-50 MCG/DOSE AEPB Inhale 1 puff into the lungs 2 (two) times daily.    [provider]  ketoconazole (NIZORAL) 2 % shampoo APPLY 1 APPLICATION TOPICALLY 2 (TWO) TIMES A WEEK. 08/30/20   Lauree Chandler, NP  montelukast (SINGULAIR) 10 MG tablet Take 10 mg every morning by mouth.     [provider]    Allergies Sulfa antibiotics  Family History  Problem Relation Age of Onset  . Other Mother        40 yrs old  . Cancer Father   . Asthma Brother     Social History Social History   Tobacco Use  . Smoking status: Former Smoker    Quit date: 01/22/1963    Years since quitting: 57.6  . Smokeless tobacco: Never Used  Vaping Use  . Vaping Use: Never used  Substance Use Topics  . Alcohol use: Yes    Comment: 1 drink 2-3 days a week.  . Drug use: No  Review of Systems  Constitutional: No fever/chills Eyes:  No discharge ENT: No upper respiratory complaints. Respiratory: no cough. No SOB/ use of accessory muscles to breath Gastrointestinal:   No nausea, no vomiting.  No diarrhea.  No constipation. Musculoskeletal: Negative for musculoskeletal pain. Skin: Patient has laceration.     ____________________________________________   PHYSICAL EXAM:  VITAL SIGNS: ED Triage Vitals  Enc Vitals Group     BP 09/07/20 1415 (!) 157/81     Pulse Rate 09/07/20 1415 76     Resp 09/07/20 1415 16     Temp 09/07/20 1410 98.4 F (36.9 C)     Temp Source 09/07/20 1410 Oral     SpO2 09/07/20 1415 100 %     Weight 09/07/20 1412 117 lb (53.1 kg)     Height 09/07/20 1412 5\' 3"  (1.6 m)     Head Circumference --      Peak Flow --      Pain Score 09/07/20  1411 8     Pain Loc --      Pain Edu? --      Excl. in [place]? --      Constitutional: Alert and oriented. Well appearing and in no acute distress. Eyes: Conjunctivae are normal. PERRL. EOMI. Head: Atraumatic. ENT: Cardiovascular: Normal rate, regular rhythm. Normal S1 and S2.  Good peripheral circulation. Respiratory: Normal respiratory effort without tachypnea or retractions. Lungs CTAB. Good air entry to the bases with no decreased or absent breath sounds Gastrointestinal: Bowel sounds x 4 quadrants. Soft and nontender to palpation. No guarding or rigidity. No distention. Musculoskeletal: Full range of motion to all extremities. No obvious deformities noted Neurologic:  Normal for age. No gross focal neurologic deficits are appreciated.  Skin: Patient has 6 cm laceration at left medial ankle.  Laceration is deep to underlying adipose tissue. Psychiatric: Mood and affect are normal for age. Speech and behavior are normal.   ____________________________________________   LABS (all labs ordered are listed, but only abnormal results are displayed)  Labs Reviewed - No data to display ____________________________________________  EKG   ____________________________________________  RADIOLOGY   No results found.  ____________________________________________    PROCEDURES  Procedure(s) performed:     Marland KitchenMarland KitchenLaceration Repair  Date/Time: 09/07/2020 4:07 PM Performed by: Lannie Fields, PA-C Authorized by: Lannie Fields, PA-C   Consent:    Consent obtained:  Verbal   Consent given by:  Patient   Risks discussed:  Infection, pain, retained foreign body, poor cosmetic result and poor wound healing Universal protocol:    Procedure explained and questions answered to patient or proxy's satisfaction: yes     Patient identity confirmed:  Verbally with patient Anesthesia:    Anesthesia method:  Local infiltration   Local anesthetic:  Lidocaine 1% w/o epi Laceration details:     Location:  Leg   Length (cm):  6   Depth (mm):  5 Pre-procedure details:    Preparation:  Patient was prepped and draped in usual sterile fashion Exploration:    Hemostasis achieved with:  Direct pressure   Contaminated: no   Treatment:    Area cleansed with:  Saline   Amount of cleaning:  Extensive   Irrigation solution:  Sterile saline   Visualized foreign bodies/material removed: no   Skin repair:    Repair method:  Sutures   Suture size:  4-0   Suture material:  Nylon   Suture technique:  Running locked Approximation:    Approximation:  Close Repair type:  Repair type:  Simple Post-procedure details:    Dressing:  Sterile dressing   Procedure completion:  Tolerated well, no immediate complications       Medications  Tdap (BOOSTRIX) injection 0.5 mL (has no administration in time range)  lidocaine (XYLOCAINE) 1 % (with pres) injection 10 mL (10 mLs Infiltration Given by Other 09/07/20 1556)     ____________________________________________   INITIAL IMPRESSION / ASSESSMENT AND PLAN / ED COURSE  Pertinent labs & imaging results that were available during my care of the patient were reviewed by me and considered in my medical decision making (see chart for details).      Assessment and plan Laceration 87 year old female presents to the emergency department with a 6 cm laceration along left medial ankle.  Patient was hypertensive at triage but vital signs were otherwise reassuring.  Laceration repair occurred in the emergency department without complication.  She was advised to have sutures removed by primary care in 7 days.  She was discharged with Keflex.  All patient questions were answered.     ____________________________________________  FINAL CLINICAL IMPRESSION(S) / ED DIAGNOSES  Final diagnoses:  Laceration of left lower extremity, initial encounter      NEW MEDICATIONS STARTED DURING THIS VISIT:  ED Discharge Orders         Ordered     cephALEXin (KEFLEX) 500 MG capsule  3 times daily,   Status:  Discontinued        09/07/20 1554    cephALEXin (KEFLEX) 500 MG capsule  3 times daily        09/07/20 1555              This chart was dictated using voice recognition software/Dragon. Despite best efforts to proofread, errors can occur which can change the meaning. Any change was purely unintentional.     Lannie Fields, PA-C 09/07/20 1608    Delman Kitten, MD 09/07/20 1620

## 2020-09-07 NOTE — ED Triage Notes (Signed)
Pt arrived via [facility] from [place] with c/o left lower leg lac around 2" long. Pt reports hitting it on car when getting in. blleding controlled. Lac wrapped prior to arrival. NAD noted at this time

## 2020-09-27 ENCOUNTER — Ambulatory Visit: Payer: Medicare PPO | Admitting: Nurse Practitioner

## 2020-09-27 ENCOUNTER — Encounter: Payer: Self-pay | Admitting: Nurse Practitioner

## 2020-09-27 ENCOUNTER — Other Ambulatory Visit: Payer: Self-pay

## 2020-09-27 VITALS — BP 130/70 | Temp 99.2°F | Ht 63.0 in | Wt 119.5 lb

## 2020-09-27 DIAGNOSIS — S81812D Laceration without foreign body, left lower leg, subsequent encounter: Secondary | ICD-10-CM | POA: Diagnosis not present

## 2020-09-27 NOTE — Progress Notes (Signed)
Careteam: Patient Care Team: Lauree Chandler, NP as PCP - General (Geriatric Medicine)  Advanced Directive information    Allergies  Allergen Reactions   Sulfa Antibiotics Nausea Only    Chief Complaint  Patient presents with   Acute Visit    Patient left ankle. Fell 2 weeks ago. Still oozing, sensitive to touch. Patient has been cleaning wound as instructed.     HPI: Patient is a 87 y.o. female seen in today at the [facility] to follow up laceration on left lower leg. Sustained laceration getting into her sons car. Unsure what she hit her leg on. She has sutures removed 4 days ago and steri strips applied. She is concerned because area is still oozing. No pain. Redness and swelling is improving.   Review of Systems:  Review of Systems  Constitutional:  Negative for chills and fever.  Cardiovascular:  Positive for leg swelling (improve).  Musculoskeletal:  Negative for myalgias.  Skin:  Negative for itching and rash.   Past Medical History:  Diagnosis Date   Arthritis    Asthma    Black-out (not amnesia) 2016   Cataract    OS   Vertigo    2 episodes.  Most recent approx 2019   Wears hearing aid in both ears    Past Surgical History:  Procedure Laterality Date   APPENDECTOMY     CATARACT EXTRACTION Right    CATARACT EXTRACTION W/PHACO Left 06/05/2020   Procedure: CATARACT EXTRACTION PHACO AND INTRAOCULAR LENS PLACEMENT (IOC) LEFT 8.23 00:44.3;  Surgeon: Birder Robson, MD;  Location: [facility];  Service: Ophthalmology;  Laterality: Left;   EYE SURGERY Right 05/24/2018   Cat Sx   TONSILLECTOMY     78 yr old   Social History:   reports that she quit smoking about 57 years ago. She has never used smokeless tobacco. She reports current alcohol use. She reports that she does not use drugs.  Family History  Problem Relation Age of Onset   Other Mother        73 yrs old   Cancer Father    Asthma Brother     Medications: Patient's  Medications  New Prescriptions   No medications on file  Previous Medications   ALBUTEROL (VENTOLIN HFA) 108 (90 BASE) MCG/ACT INHALER    Inhale into the lungs every 6 (six) hours as needed for wheezing or shortness of breath.   CALCIUM CARBONATE (OSCAL) 1500 (600 CA) MG TABS TABLET    Take 1,500 mg daily with breakfast by mouth.    FLUTICASONE-SALMETEROL (ADVAIR) 250-50 MCG/DOSE AEPB    Inhale 1 puff into the lungs 2 (two) times daily.   KETOCONAZOLE (NIZORAL) 2 % SHAMPOO    APPLY 1 APPLICATION TOPICALLY 2 (TWO) TIMES A WEEK.   MONTELUKAST (SINGULAIR) 10 MG TABLET    Take 10 mg every morning by mouth.   Modified Medications   No medications on file  Discontinued Medications   No medications on file    Physical Exam:  There were no vitals filed for this visit. There is no height or weight on file to calculate BMI. Wt Readings from Last 3 Encounters:  09/07/20 117 lb (53.1 kg)  07/10/20 117 lb 8 oz (53.3 kg)  06/05/20 115 lb (52.2 kg)    Physical Exam Constitutional:      Appearance: Normal appearance.  Skin:    General: Skin is warm and dry.  Comments: U shaped laceration noted to left lower leg. Steristrips in place, mild edema and erythema with serous drainage noted to dressing.   Neurological:     Mental Status: She is alert and oriented to person, place, and time.    Labs reviewed: Basic Metabolic Panel: Recent Labs    08/13/20 0000  NA 134*  K 4.5  CL 100  CO2 31*  BUN 10  CREATININE 0.7   Liver Function Tests: Recent Labs    08/13/20 0000  AST 41*  ALT 25  ALKPHOS 77   No results for input(s): LIPASE, AMYLASE in the last 8760 hours. No results for input(s): AMMONIA in the last 8760 hours. CBC: Recent Labs    08/13/20 0000  WBC 6.6  HGB 14.1  HCT 43  PLT 349   Lipid Panel: No results for input(s): CHOL, HDL, LDLCALC, TRIG, CHOLHDL, LDLDIRECT in the last 8760 hours. TSH: No results for input(s): TSH in the last 8760 hours. A1C: No  results found for: HGBA1C   Assessment/Plan 1. Laceration of left lower extremity, subsequent encounter -reassurance given. Overall swelling, redness has improved. No pain noted.  She has been using nonstick dressing and draining around this. Instructions given to use 4x4 and changes at least twice daily or when wet. To follow up in clinic next week with [place] nurse if she has any questions are concerns.   Next appt: 02/05/2021  Carlos American. [place], [facility] [phone]

## 2020-10-08 ENCOUNTER — Encounter: Payer: Self-pay | Admitting: Family

## 2020-10-08 ENCOUNTER — Other Ambulatory Visit: Payer: Self-pay

## 2020-10-08 ENCOUNTER — Telehealth (INDEPENDENT_AMBULATORY_CARE_PROVIDER_SITE_OTHER): Payer: Medicare PPO | Admitting: Family

## 2020-10-08 ENCOUNTER — Telehealth: Payer: Self-pay

## 2020-10-08 DIAGNOSIS — U071 COVID-19: Secondary | ICD-10-CM | POA: Diagnosis not present

## 2020-10-08 MED ORDER — VITAMIN C 1000 MG PO TABS: 1000.0000 mg | ORAL_TABLET | Freq: Every day | ORAL | 0 refills | Status: AC

## 2020-10-08 MED ORDER — ZINC GLUCONATE 50 MG PO TABS: 50.0000 mg | ORAL_TABLET | Freq: Every day | ORAL | 0 refills | Status: AC

## 2020-10-08 NOTE — Progress Notes (Signed)
This service is provided via telemedicine  No vital signs collected/recorded due to the encounter was a telemedicine visit.   Location of patient (ex: home, work): Home.   Patient consents to a telephone visit: Yes.  Location of the provider (ex: office, home): [employer].   Name of any referring provider: Lauree Chandler, NP   Names of all persons participating in the telemedicine service and their role in the encounter:Patient, Heriberto Antigua, [place], [place], [place], NP.    Time spent on call:8 minutes spent on the phone with Medical Assistant.     Provider: Fatime Biswell FNP-C  Lauree Chandler, NP  Patient Care Team: Lauree Chandler, NP as PCP - General (Geriatric Medicine)  Extended Emergency Contact Information Primary Emergency Contact: Scherrie Gerlach Mobile Phone: [phone] Relation: Daughter  Code Status:  Full Code  Goals of care: Advanced Directive information Advanced Directives 10/08/2020  Does Patient Have a Medical Advance Directive? Yes  Type of Paramedic of [place];Living will;Out of facility DNR (pink MOST or yellow form)  Does patient want to make changes to medical advance directive? No - Patient declined  Copy of [place] in Chart? No - copy requested  Would patient like information on creating a medical advance directive? No - Patient declined     Chief Complaint  Patient presents with   Acute Visit    Complains of testing positive for Covid 10/04/2020    HPI:  Pt is a 87 y.o. female seen today for an acute visit for evaluation of COVID-19 test positive on 10/04/2020.States feeling much better today. Appetite not too good.Food does not taste good.Had cereal,peach,ensure and nuts orange juices today.also had a good dinner last night.Not drinking enough water. Has had chills but no fever.feeling of nausea but no vomiting. Had diarrhea but has resolved. She denies any  fever,cough,shortness breath,chest pain or chest tightness Wonders whether her 3 rd COVID-19 vaccine gave her COVID.States had COVID-19 vaccine on Wednesday and test positive on Thursday.  Past Medical History:  Diagnosis Date   Arthritis    Asthma    Black-out (not amnesia) 2016   Cataract    OS   Vertigo    2 episodes.  Most recent approx 2019   Wears hearing aid in both ears    Past Surgical History:  Procedure Laterality Date   APPENDECTOMY     CATARACT EXTRACTION Right    CATARACT EXTRACTION W/PHACO Left 06/05/2020   Procedure: CATARACT EXTRACTION PHACO AND INTRAOCULAR LENS PLACEMENT (IOC) LEFT 8.23 00:44.3;  Surgeon: Birder Robson, MD;  Location: [facility];  Service: Ophthalmology;  Laterality: Left;   EYE SURGERY Right 05/24/2018   Cat Sx   TONSILLECTOMY     8 yr old    Allergies  Allergen Reactions   Sulfa Antibiotics Nausea Only    Outpatient Encounter Medications as of 10/08/2020  Medication Sig   albuterol (VENTOLIN HFA) 108 (90 Base) MCG/ACT inhaler Inhale into the lungs every 6 (six) hours as needed for wheezing or shortness of breath.   calcium carbonate (OSCAL) 1500 (600 Ca) MG TABS tablet Take 1,500 mg daily with breakfast by mouth.    Fluticasone-Salmeterol (ADVAIR) 250-50 MCG/DOSE AEPB Inhale 1 puff into the lungs 2 (two) times daily.   ketoconazole (NIZORAL) 2 % shampoo Apply 1 application topically once a week.   montelukast (SINGULAIR) 10 MG tablet Take 10 mg every morning by mouth.    [DISCONTINUED] ketoconazole (NIZORAL) 2 %  shampoo APPLY 1 APPLICATION TOPICALLY 2 (TWO) TIMES A WEEK. (Patient taking differently: Apply 1 application topically once a week.)   No facility-administered encounter medications on file as of 10/08/2020.    Review of Systems  Constitutional:  Positive for appetite change. Negative for chills, fatigue, fever and unexpected weight change.  HENT:  Negative for congestion, dental problem, ear discharge, ear pain,  facial swelling, hearing loss, nosebleeds, postnasal drip, rhinorrhea, sinus pressure, sinus pain, sneezing, sore throat, tinnitus and trouble swallowing.   Eyes:  Negative for pain, discharge, redness, itching and visual disturbance.  Respiratory:  Negative for cough, chest tightness, shortness of breath and wheezing.   Cardiovascular:  Negative for chest pain, palpitations and leg swelling.  Gastrointestinal:  Negative for abdominal distention, abdominal pain, blood in stool, constipation, diarrhea, nausea and vomiting.  Genitourinary:  Negative for difficulty urinating, dysuria, flank pain, frequency and urgency.  Musculoskeletal:  Negative for arthralgias, back pain, gait problem, joint swelling, myalgias, neck pain and neck stiffness.  Skin:  Negative for color change, pallor, rash and wound.  Neurological:  Negative for dizziness, syncope, speech difficulty, weakness, light-headedness, numbness and headaches.  Hematological:  Does not bruise/bleed easily.  Psychiatric/Behavioral:  Negative for agitation, behavioral problems, confusion, hallucinations, self-injury, sleep disturbance and suicidal ideas. The patient is not nervous/anxious.    Immunization History  Administered Date(s) Administered   Influenza, High Dose Seasonal PF 01/22/2018   Influenza-Unspecified 03/14/2013, 12/14/2019   Moderna Sars-Covid-2 Vaccination 04/26/2019, 05/24/2019   Pneumococcal Conjugate-13 04/24/2015   Td 05/18/2018   Tdap 05/18/2018, 09/07/2020   Pertinent  Health Maintenance Due  Topic Date Due   PNA vac Low Risk Adult (2 of 2 - PPSV23) 04/23/2016   INFLUENZA VACCINE  11/12/2020   DEXA SCAN  Completed   Fall Risk  10/08/2020  Falls in the past year? 0  Number falls in past yr: 0  Injury with Fall? 0   Functional Status Survey:    There were no vitals filed for this visit. There is no height or weight on file to calculate BMI. Physical Exam Unable to complete on Telephone visit.   Labs  reviewed: Recent Labs    08/13/20 0000  NA 134*  K 4.5  CL 100  CO2 31*  BUN 10  CREATININE 0.7   Recent Labs    08/13/20 0000  AST 41*  ALT 25  ALKPHOS 77   Recent Labs    08/13/20 0000  WBC 6.6  HGB 14.1  HCT 43  PLT 349   No results found for: TSH No results found for: HGBA1C No results found for: CHOL, HDL, LDLCALC, LDLDIRECT, TRIG, CHOLHDL  Significant Diagnostic Results in last 30 days:  No results found.  Assessment/Plan  COVID-19 virus infection Reports no fever.symptoms have improved. Has completed her self quarantine last day today. - advised to notify provider or go to ED if having any shortness of breath,wheezing,chest tightness or chest pain or palpitation. - will continue with supportive care. - encouraged to drink 6-8 glassed of water daily. - vit C and Zinc as below for 14 days - continue on Vitamin d supplement - Advised to continue to wear facial mask,perform hand hygiene and social distance  - zinc gluconate 50 MG tablet; Take 1 tablet (50 mg total) by mouth daily for 14 days.  Dispense: 14 tablet; Refill: 0 - Ascorbic Acid (VITAMIN C) 1000 MG tablet; Take 1 tablet (1,000 mg total) by mouth daily for 14 days.  Dispense: 14 tablet; Refill:  0   Family/ staff Communication: Reviewed plan of care with patient verbalized understanding   Labs/tests ordered: None   Next Appointment: As needed if symptoms worsen or fail to improve   I connected with  Dorthula Perfect on 10/08/20 by a Telephone enabled telemedicine application and verified that I am speaking with the correct person using two identifiers.   I discussed the limitations of evaluation and management by telemedicine. The patient expressed understanding and agreed to proceed.  Spent 15 minutes of non-face to face with patient on telephone.      Sandrea Hughs, NP

## 2020-10-08 NOTE — Patient Instructions (Addendum)
-   drink 6-8 glassed of water daily. - continue on Vitamin d supplement - continue to wear facial mask,perform hand hygiene and social distance  - zinc gluconate 50 MG tablet; Take 1 tablet (50 mg total) by mouth daily for 14 days.  Dispense: 14 tablet; Refill: 0 - Ascorbic Acid (VITAMIN C) 1000 MG tablet; Take 1 tablet (1,000 mg total) by mouth daily for 14 days.  Dispense: 14 tablet; Refill: 0 - notify provider or go to ED if having any shortness of breath,wheezing,chest tightness or chest pain or palpitation.

## 2020-10-08 NOTE — Telephone Encounter (Signed)
Sharon Hood, Sharon Hood are scheduled for a virtual visit with your provider today.    Just as we do with appointments in the office, we must obtain your consent to participate.  Your consent will be active for this visit and any virtual visit you may have with one of our providers in the next 365 days.    If you have a MyChart account, I can also send a copy of this consent to you electronically.  All virtual visits are billed to your insurance company just like a traditional visit in the office.  As this is a virtual visit, video technology does not allow for your provider to perform a traditional examination.  This may limit your provider's ability to fully assess your condition.  If your provider identifies any concerns that need to be evaluated in person or the need to arrange testing such as labs, EKG, etc, we will make arrangements to do so.    Although advances in technology are sophisticated, we cannot ensure that it will always work on either your end or our end.  If the connection with a video visit is poor, we may have to switch to a telephone visit.  With either a video or telephone visit, we are not always able to ensure that we have a secure connection.   I need to obtain your verbal consent now.   Are you willing to proceed with your visit today?   SHANTIA SANFORD has provided verbal consent on 10/08/2020 for a virtual visit (video or telephone).   [place], [place] 10/08/2020  3:15 PM

## 2020-12-03 DIAGNOSIS — H353211 Exudative age-related macular degeneration, right eye, with active choroidal neovascularization: Secondary | ICD-10-CM | POA: Diagnosis not present

## 2020-12-12 DIAGNOSIS — H6123 Impacted cerumen, bilateral: Secondary | ICD-10-CM | POA: Diagnosis not present

## 2020-12-12 DIAGNOSIS — H903 Sensorineural hearing loss, bilateral: Secondary | ICD-10-CM | POA: Diagnosis not present

## 2021-01-10 DIAGNOSIS — D2262 Melanocytic nevi of left upper limb, including shoulder: Secondary | ICD-10-CM | POA: Diagnosis not present

## 2021-01-10 DIAGNOSIS — D692 Other nonthrombocytopenic purpura: Secondary | ICD-10-CM | POA: Diagnosis not present

## 2021-01-10 DIAGNOSIS — L218 Other seborrheic dermatitis: Secondary | ICD-10-CM | POA: Diagnosis not present

## 2021-01-10 DIAGNOSIS — L821 Other seborrheic keratosis: Secondary | ICD-10-CM | POA: Diagnosis not present

## 2021-01-10 DIAGNOSIS — D2261 Melanocytic nevi of right upper limb, including shoulder: Secondary | ICD-10-CM | POA: Diagnosis not present

## 2021-01-10 DIAGNOSIS — L720 Epidermal cyst: Secondary | ICD-10-CM | POA: Diagnosis not present

## 2021-01-10 DIAGNOSIS — D2272 Melanocytic nevi of left lower limb, including hip: Secondary | ICD-10-CM | POA: Diagnosis not present

## 2021-01-10 DIAGNOSIS — D2271 Melanocytic nevi of right lower limb, including hip: Secondary | ICD-10-CM | POA: Diagnosis not present

## 2021-01-10 DIAGNOSIS — D225 Melanocytic nevi of trunk: Secondary | ICD-10-CM | POA: Diagnosis not present

## 2021-01-21 DIAGNOSIS — M1711 Unilateral primary osteoarthritis, right knee: Secondary | ICD-10-CM | POA: Diagnosis not present

## 2021-01-29 ENCOUNTER — Telehealth: Payer: Self-pay | Admitting: *Deleted

## 2021-01-29 NOTE — Telephone Encounter (Signed)
Tried calling patient and left message letting patient know that she could come to clinic to do blood work again on Thursday if she wants and to call office to let us know. [facility] lab schedule is Monday and Thursday 7:30 am

## 2021-01-29 NOTE — Telephone Encounter (Signed)
Clarification, patient was supposed to have lab work 01/28/21. I have copy of requisition that was place on lab tech shelf at [facility]. Somehow requisition was misplaced. I will contact patient to see if she would like to reschedule appointment according to [facility] lab schedule

## 2021-01-29 NOTE — Telephone Encounter (Signed)
Her last labs were May the 2nd after her last appt. We did not order labs prior to this appt. Apologize for any confusion in regards to this. If she wants her labs done prior to the appt we can arrange this or we can order at her next follow up.

## 2021-01-29 NOTE — Telephone Encounter (Signed)
Spoke with [facility] Draw department and patient is scheduled for home draw on Wednesday 01/30/2021.

## 2021-01-29 NOTE — Telephone Encounter (Signed)
Patient called upset because she went down at [facility] yesterday to get labwork done early in the morning and no one was there.   Stated that it was raining and cold and dark and she had to wait and no one got there until 7:13 and then they did not have any orders for her labs.   Patient stated that she was looking at a piece of paper that stated that she had labwork to get done before her appointment on 10/25.   Patient is requesting Sharon Hood or her Medical Assistant to give her a call at [phone]

## 2021-01-30 DIAGNOSIS — M8949 Other hypertrophic osteoarthropathy, multiple sites: Secondary | ICD-10-CM | POA: Diagnosis not present

## 2021-01-30 DIAGNOSIS — M810 Age-related osteoporosis without current pathological fracture: Secondary | ICD-10-CM | POA: Diagnosis not present

## 2021-01-31 LAB — BASIC METABOLIC PANEL
BUN: 11 (ref 4–21)
CO2: 27 — AB (ref 13–22)
Chloride: 102 (ref 99–108)
Creatinine: 0.8 (ref 0.5–1.1)
Glucose: 78
Potassium: 4.6 (ref 3.4–5.3)
Sodium: 137 (ref 137–147)

## 2021-01-31 LAB — CBC AND DIFFERENTIAL
HCT: 44 (ref 36–46)
Hemoglobin: 14.5 (ref 12.0–16.0)
Neutrophils Absolute: 2596
Platelets: 319 (ref 150–399)
WBC: 6.3

## 2021-01-31 LAB — HEPATIC FUNCTION PANEL
ALT: 13 (ref 7–35)
AST: 30 (ref 13–35)
Alkaline Phosphatase: 58 (ref 25–125)
Bilirubin, Total: 0.5

## 2021-01-31 LAB — COMPREHENSIVE METABOLIC PANEL
Albumin: 3.9 (ref 3.5–5.0)
Calcium: 9.6 (ref 8.7–10.7)
Globulin: 2.5

## 2021-01-31 LAB — CBC: RBC: 4.83 (ref 3.87–5.11)

## 2021-02-01 ENCOUNTER — Telehealth: Payer: Self-pay

## 2021-02-01 NOTE — Telephone Encounter (Signed)
Discussed results with patient and she verbalized her understanding.  Blood counts, electrolytes, glucose, liver and kidney function are stable

## 2021-02-05 ENCOUNTER — Other Ambulatory Visit: Payer: Self-pay

## 2021-02-05 ENCOUNTER — Encounter: Payer: Self-pay | Admitting: Nurse Practitioner

## 2021-02-05 ENCOUNTER — Ambulatory Visit: Payer: Medicare PPO | Admitting: Nurse Practitioner

## 2021-02-05 VITALS — BP 118/70 | HR 76 | Temp 99.0°F | Ht 63.0 in | Wt 118.0 lb

## 2021-02-05 DIAGNOSIS — L218 Other seborrheic dermatitis: Secondary | ICD-10-CM | POA: Diagnosis not present

## 2021-02-05 DIAGNOSIS — J4520 Mild intermittent asthma, uncomplicated: Secondary | ICD-10-CM | POA: Diagnosis not present

## 2021-02-05 DIAGNOSIS — R600 Localized edema: Secondary | ICD-10-CM | POA: Diagnosis not present

## 2021-02-05 DIAGNOSIS — M159 Polyosteoarthritis, unspecified: Secondary | ICD-10-CM

## 2021-02-05 DIAGNOSIS — I872 Venous insufficiency (chronic) (peripheral): Secondary | ICD-10-CM | POA: Diagnosis not present

## 2021-02-05 DIAGNOSIS — M810 Age-related osteoporosis without current pathological fracture: Secondary | ICD-10-CM | POA: Diagnosis not present

## 2021-02-05 NOTE — Progress Notes (Signed)
Careteam: Patient Care Team: Lauree Chandler, NP as PCP - General (Geriatric Medicine)  PLACE OF SERVICE:  [facility] Directive information Does Patient Have a Medical Advance Directive?: Yes, Type of Advance Directive: [place];Living will;Out of facility DNR (pink MOST or yellow form), Does patient want to make changes to medical advance directive?: No - Patient declined  Allergies  Allergen Reactions   Sulfa Antibiotics Nausea Only    Chief Complaint  Patient presents with   Medical Management of Chronic Issues    6 month follow up visit. Patient received Flu vaccine at [facility]   Health Maintenance    Pneumonia vaccine, 2nd COVID booster, zostr vaccine,     HPI: Patient is a 87 y.o. female for routine follow up. She turned 89 2 days ago.  Had a get together with her family and then with the [facility].  Has had 4 days of celebrating.   The lab called her at home at 6 am.   Asthma- continues on advair- now taking twice a day and has noticed a difference and not had any problem since. Continues on singulair. Continues on albuterol which she rarely needs.   She is up to date on her pneumonia vaccine.    Review of Systems:  Review of Systems  Constitutional:  Negative for chills, fever and weight loss.  HENT:  Negative for tinnitus.   Respiratory:  Negative for cough, sputum production and shortness of breath.   Cardiovascular:  Negative for chest pain, palpitations and leg swelling.  Gastrointestinal:  Negative for abdominal pain, constipation, diarrhea and heartburn.  Genitourinary:  Negative for dysuria, frequency and urgency.  Musculoskeletal:  Negative for back pain, falls, joint pain and myalgias.  Skin: Negative.   Neurological:  Negative for dizziness and headaches.  Psychiatric/Behavioral:  Negative for depression and memory loss. The patient does not have insomnia.    Past Medical History:  Diagnosis Date    Arthritis    Asthma    Black-out (not amnesia) 2016   Cataract    OS   Vertigo    2 episodes.  Most recent approx 2019   Wears hearing aid in both ears    Past Surgical History:  Procedure Laterality Date   APPENDECTOMY     CATARACT EXTRACTION Right    CATARACT EXTRACTION W/PHACO Left 06/05/2020   Procedure: CATARACT EXTRACTION PHACO AND INTRAOCULAR LENS PLACEMENT (IOC) LEFT 8.23 00:44.3;  Surgeon: Birder Robson, MD;  Location: [facility];  Service: Ophthalmology;  Laterality: Left;   EYE SURGERY Right 05/24/2018   Cat Sx   TONSILLECTOMY     89 yr old   Social History:   reports that she quit smoking about 58 years ago. Her smoking use included cigarettes. She has never used smokeless tobacco. She reports current alcohol use. She reports that she does not use drugs.  Family History  Problem Relation Age of Onset   Other Mother        5 yrs old   Cancer Father    Asthma Brother     Medications: Patient's Medications  New Prescriptions   No medications on file  Previous Medications   ALBUTEROL (VENTOLIN HFA) 108 (90 BASE) MCG/ACT INHALER    Inhale into the lungs every 6 (six) hours as needed for wheezing or shortness of breath.   CALCIUM CARBONATE (OSCAL) 1500 (600 CA) MG TABS TABLET    Take 1,500 mg daily with breakfast by mouth.  FLUTICASONE-SALMETEROL (ADVAIR) 250-50 MCG/DOSE AEPB    Inhale 1 puff into the lungs 2 (two) times daily as needed.   KETOCONAZOLE (NIZORAL) 2 % SHAMPOO    Apply 1 application topically once a week.   MONTELUKAST (SINGULAIR) 10 MG TABLET    Take 10 mg every morning by mouth.   Modified Medications   No medications on file  Discontinued Medications   No medications on file    Physical Exam:  Vitals:   02/05/21 1106  BP: 118/70  Pulse: 76  Temp: 99 F (37.2 C)  TempSrc: Oral  SpO2: 97%  Weight: 118 lb (53.5 kg)  Height: 5\' 3"  (1.6 m)   Body mass index is 20.9 kg/m. Wt Readings from Last 3 Encounters:  02/05/21 118  lb (53.5 kg)  09/27/20 119 lb 8 oz (54.2 kg)  09/07/20 117 lb (53.1 kg)    Physical Exam Constitutional:      General: She is not in acute distress.    Appearance: She is well-developed. She is not diaphoretic.  HENT:     Head: Normocephalic and atraumatic.     Mouth/Throat:     Pharynx: No oropharyngeal exudate.  Eyes:     Conjunctiva/sclera: Conjunctivae normal.     Pupils: Pupils are equal, round, and reactive to light.  Cardiovascular:     Rate and Rhythm: Normal rate and regular rhythm.     Heart sounds: Normal heart sounds.  Pulmonary:     Effort: Pulmonary effort is normal.     Breath sounds: Normal breath sounds.  Abdominal:     General: Bowel sounds are normal.     Palpations: Abdomen is soft.  Musculoskeletal:     Cervical back: Normal range of motion and neck supple.     Right lower leg: No edema.     Left lower leg: No edema.  Skin:    General: Skin is warm and dry.  Neurological:     Mental Status: She is alert.  Psychiatric:        Mood and Affect: Mood normal.    Labs reviewed: Basic Metabolic Panel: Recent Labs    08/13/20 0000  NA 134*  K 4.5  CL 100  CO2 31*  BUN 10  CREATININE 0.7   Liver Function Tests: Recent Labs    08/13/20 0000  AST 41*  ALT 25  ALKPHOS 77   No results for input(s): LIPASE, AMYLASE in the last 8760 hours. No results for input(s): AMMONIA in the last 8760 hours. CBC: Recent Labs    08/13/20 0000  WBC 6.6  HGB 14.1  HCT 43  PLT 349   Lipid Panel: No results for input(s): CHOL, HDL, LDLCALC, TRIG, CHOLHDL, LDLDIRECT in the last 8760 hours. TSH: No results for input(s): TSH in the last 8760 hours. A1C: No results found for: HGBA1C   Assessment/Plan 1. Mild intermittent asthma without complication -improved symptoms since she is taking advair routinely. Continues current regimen.   2. Primary osteoarthritis involving multiple joints -stable, no worsening pain.   3. Seborrhea corporis -continues on  nizoral shampoo   4. Age-related osteoporosis without current pathological fracture -continues on cal and vit d   5. Edema of left lower leg due to peripheral venous insufficiency -improved with elevation and decrease sodium in diet.    Next appt: 6 months.  Carlos American. [place], [facility] [phone]

## 2021-03-18 ENCOUNTER — Ambulatory Visit: Payer: Self-pay | Admitting: Nurse Practitioner

## 2021-03-21 ENCOUNTER — Telehealth: Payer: Self-pay

## 2021-03-21 ENCOUNTER — Other Ambulatory Visit: Payer: Self-pay

## 2021-03-21 ENCOUNTER — Encounter: Payer: Self-pay | Admitting: Nurse Practitioner

## 2021-03-21 ENCOUNTER — Ambulatory Visit (INDEPENDENT_AMBULATORY_CARE_PROVIDER_SITE_OTHER): Payer: Medicare PPO | Admitting: Nurse Practitioner

## 2021-03-21 DIAGNOSIS — Z0000 Encounter for general adult medical examination without abnormal findings: Secondary | ICD-10-CM | POA: Diagnosis not present

## 2021-03-21 NOTE — Patient Instructions (Signed)
Sharon Hood , Thank you for taking time to come for your Medicare Wellness Visit. I appreciate your ongoing commitment to your health goals. Please review the following plan we discussed and let me know if I can assist you in the future.   Screening recommendations/referrals: Colonoscopy aged out Mammogram aged out Bone Density - declines  Recommended yearly ophthalmology/optometry visit for glaucoma screening and checkup Recommended yearly dental visit for hygiene and checkup  Vaccinations: Influenza vaccine up to date Pneumococcal vaccine up to date Tdap vaccine up to date Shingles vaccine up to date    Advanced directives: on file   Conditions/risks identified: advance age  Next appointment: yearly for awv   Preventive Care 34 Years and Older, Female Preventive care refers to lifestyle choices and visits with your health care provider that can promote health and wellness. What does preventive care include? A yearly physical exam. This is also called an annual well check. Dental exams once or twice a year. Routine eye exams. Ask your health care provider how often you should have your eyes checked. Personal lifestyle choices, including: Daily care of your teeth and gums. Regular physical activity. Eating a healthy diet. Avoiding tobacco and drug use. Limiting alcohol use. Practicing safe sex. Taking low-dose aspirin every day. Taking vitamin and mineral supplements as recommended by your health care provider. What happens during an annual well check? The services and screenings done by your health care provider during your annual well check will depend on your age, overall health, lifestyle risk factors, and family history of disease. Counseling  Your health care provider may ask you questions about your: Alcohol use. Tobacco use. Drug use. Emotional well-being. Home and relationship well-being. Sexual activity. Eating habits. History of falls. Memory and ability  to understand (cognition). Work and work Statistician. Reproductive health. Screening  You may have the following tests or measurements: Height, weight, and BMI. Blood pressure. Lipid and cholesterol levels. These may be checked every 5 years, or more frequently if you are over 54 years old. Skin check. Lung cancer screening. You may have this screening every year starting at age 37 if you have a 30-pack-year history of smoking and currently smoke or have quit within the past 15 years. Fecal occult blood test (FOBT) of the stool. You may have this test every year starting at age 77. Flexible sigmoidoscopy or colonoscopy. You may have a sigmoidoscopy every 5 years or a colonoscopy every 10 years starting at age 51. Hepatitis C blood test. Hepatitis B blood test. Sexually transmitted disease (STD) testing. Diabetes screening. This is done by checking your blood sugar (glucose) after you have not eaten for a while (fasting). You may have this done every 1-3 years. Bone density scan. This is done to screen for osteoporosis. You may have this done starting at age 75. Mammogram. This may be done every 1-2 years. Talk to your health care provider about how often you should have regular mammograms. Talk with your health care provider about your test results, treatment options, and if necessary, the need for more tests. Vaccines  Your health care provider may recommend certain vaccines, such as: Influenza vaccine. This is recommended every year. Tetanus, diphtheria, and acellular pertussis (Tdap, Td) vaccine. You may need a Td booster every 10 years. Zoster vaccine. You may need this after age 27. Pneumococcal 13-valent conjugate (PCV13) vaccine. One dose is recommended after age 31. Pneumococcal polysaccharide (PPSV23) vaccine. One dose is recommended after age 50. Talk to your health care provider  about which screenings and vaccines you need and how often you need them. This information is not  intended to replace advice given to you by your health care provider. Make sure you discuss any questions you have with your health care provider. Document Released: 04/27/2015 Document Revised: 12/19/2015 Document Reviewed: 01/30/2015 Elsevier Interactive Patient Education  2017 [employer] Prevention in the Home Falls can cause injuries. They can happen to people of all ages. There are many things you can do to make your home safe and to help prevent falls. What can I do on the outside of my home? Regularly fix the edges of walkways and driveways and fix any cracks. Remove anything that might make you trip as you walk through a door, such as a raised step or threshold. Trim any bushes or trees on the path to your home. Use bright outdoor lighting. Clear any walking paths of anything that might make someone trip, such as rocks or tools. Regularly check to see if handrails are loose or broken. Make sure that both sides of any steps have handrails. Any raised decks and porches should have guardrails on the edges. Have any leaves, snow, or ice cleared regularly. Use sand or salt on walking paths during winter. Clean up any spills in your garage right away. This includes oil or grease spills. What can I do in the bathroom? Use night lights. Install grab bars by the toilet and in the tub and shower. Do not use towel bars as grab bars. Use non-skid mats or decals in the tub or shower. If you need to sit down in the shower, use a plastic, non-slip stool. Keep the floor dry. Clean up any water that spills on the floor as soon as it happens. Remove soap buildup in the tub or shower regularly. Attach bath mats securely with double-sided non-slip rug tape. Do not have throw rugs and other things on the floor that can make you trip. What can I do in the bedroom? Use night lights. Make sure that you have a light by your bed that is easy to reach. Do not use any sheets or blankets that are  too big for your bed. They should not hang down onto the floor. Have a firm chair that has side arms. You can use this for support while you get dressed. Do not have throw rugs and other things on the floor that can make you trip. What can I do in the kitchen? Clean up any spills right away. Avoid walking on wet floors. Keep items that you use a lot in easy-to-reach places. If you need to reach something above you, use a strong step stool that has a grab bar. Keep electrical cords out of the way. Do not use floor polish or wax that makes floors slippery. If you must use wax, use non-skid floor wax. Do not have throw rugs and other things on the floor that can make you trip. What can I do with my stairs? Do not leave any items on the stairs. Make sure that there are handrails on both sides of the stairs and use them. Fix handrails that are broken or loose. Make sure that handrails are as long as the stairways. Check any carpeting to make sure that it is firmly attached to the stairs. Fix any carpet that is loose or worn. Avoid having throw rugs at the top or bottom of the stairs. If you do have throw rugs, attach them to the floor  with carpet tape. Make sure that you have a light switch at the top of the stairs and the bottom of the stairs. If you do not have them, ask someone to add them for you. What else can I do to help prevent falls? Wear shoes that: Do not have high heels. Have rubber bottoms. Are comfortable and fit you well. Are closed at the toe. Do not wear sandals. If you use a stepladder: Make sure that it is fully opened. Do not climb a closed stepladder. Make sure that both sides of the stepladder are locked into place. Ask someone to hold it for you, if possible. Clearly mark and make sure that you can see: Any grab bars or handrails. First and last steps. Where the edge of each step is. Use tools that help you move around (mobility aids) if they are needed. These  include: Canes. Walkers. Scooters. Crutches. Turn on the lights when you go into a dark area. Replace any light bulbs as soon as they burn out. Set up your furniture so you have a clear path. Avoid moving your furniture around. If any of your floors are uneven, fix them. If there are any pets around you, be aware of where they are. Review your medicines with your doctor. Some medicines can make you feel dizzy. This can increase your chance of falling. Ask your doctor what other things that you can do to help prevent falls. This information is not intended to replace advice given to you by your health care provider. Make sure you discuss any questions you have with your health care provider. Document Released: 01/25/2009 Document Revised: 09/06/2015 Document Reviewed: 05/05/2014 Elsevier Interactive Patient Education  2017 [employer].

## 2021-03-21 NOTE — Progress Notes (Signed)
Subjective:   Sharon Hood is a 87 y.o. female who presents for Medicare Annual (Subsequent) preventive examination.  Review of Systems     Cardiac Risk Factors include: advanced age (>29men, >49 women)     Objective:    There were no vitals filed for this visit. There is no height or weight on file to calculate BMI.  Advanced Directives 03/21/2021 02/05/2021 10/08/2020 09/07/2020 07/10/2020 06/05/2020 02/21/2017  Does Patient Have a Medical Advance Directive? Yes Yes Yes No Yes Yes No  Type of Paramedic of [place];Living will;Out of facility DNR (pink MOST or yellow form) [place];Living will;Out of facility DNR (pink MOST or yellow form) [place];Living will;Out of facility DNR (pink MOST or yellow form) - [place];Living will [place];Living will -  Does patient want to make changes to medical advance directive? No - Patient declined No - Patient declined No - Patient declined - No - Patient declined No - Patient declined -  Copy of [place] in Chart? Yes - validated most recent copy scanned in chart (See row information) No - copy requested No - copy requested - No - copy requested Yes - validated most recent copy scanned in chart (See row information) -  Would patient like information on creating a medical advance directive? - - No - Patient declined No - Patient declined - - -  Pre-existing out of facility DNR order (yellow form or pink MOST form) Yellow form placed in chart (order not valid for inpatient use) - - - - - -    Current Medications (verified) Outpatient Encounter Medications as of 03/21/2021  Medication Sig   albuterol (VENTOLIN HFA) 108 (90 Base) MCG/ACT inhaler Inhale into the lungs every 6 (six) hours as needed for wheezing or shortness of breath.   calcium carbonate (OSCAL) 1500 (600 Ca) MG TABS tablet Take 1,500 mg daily with breakfast by  mouth.    Fluticasone-Salmeterol (ADVAIR) 250-50 MCG/DOSE AEPB Inhale 1 puff into the lungs 2 (two) times daily as needed.   ketoconazole (NIZORAL) 2 % shampoo Apply 1 application topically once a week.   montelukast (SINGULAIR) 10 MG tablet Take 10 mg every morning by mouth.    No facility-administered encounter medications on file as of 03/21/2021.    Allergies (verified) Sulfa antibiotics   History: Past Medical History:  Diagnosis Date   Arthritis    Asthma    Black-out (not amnesia) 2016   Cataract    OS   Vertigo    2 episodes.  Most recent approx 2019   Wears hearing aid in both ears    Past Surgical History:  Procedure Laterality Date   APPENDECTOMY     CATARACT EXTRACTION Right    CATARACT EXTRACTION W/PHACO Left 06/05/2020   Procedure: CATARACT EXTRACTION PHACO AND INTRAOCULAR LENS PLACEMENT (IOC) LEFT 8.23 00:44.3;  Surgeon: Birder Robson, MD;  Location: [facility];  Service: Ophthalmology;  Laterality: Left;   EYE SURGERY Right 05/24/2018   Cat Sx   TONSILLECTOMY     35 yr old   Family History  Problem Relation Age of Onset   Other Mother        49 yrs old   Cancer Father    Asthma Brother    Social History   Socioeconomic History   Marital status: Widowed    Spouse name: Not on file   Number of children: 2   Years  of education: College   Highest education level: Not on file  Occupational History   Occupation: Retired  Tobacco Use   Smoking status: Former    Types: Cigarettes    Quit date: 01/22/1963    Years since quitting: 58.2   Smokeless tobacco: Never  Vaping Use   Vaping Use: Never used  Substance and Sexual Activity   Alcohol use: Yes    Comment: 1 drink 2-3 days a week.   Drug use: No   Sexual activity: Not on file  Other Topics Concern   Not on file  Social History Narrative   Lives at home alone   Right-handed   Caffeine: about 4 cups per day      Tobacco use, amount per day now: None.   Past tobacco use, amount  per day: In my early 20's, maybe 3 cigarettes a day.   How many years did you use tobacco: Maybe 4   Alcohol use (drinks per week): 1 drink 2-3 days a week.   Diet: No.   Do you drink/eat things with caffeine: Coffee   Marital status:  Widow                                What year were you married? Twice [place] you live in a house, apartment, assisted living, condo, trailer, etc.? Condo   Is it one or more stories? One.   How many persons live in your home? Self   Do you have pets in your home?( please list) NO!   Highest Level of education completed? 4 years of college.    Current or past profession: Teacher   Do you exercise?  Yes.                               Type and how often? Walk 4-5 times a week.   Do you have a living will? Yes.   Do you have a DNR form?  Yes.                                 If not, do you want to discuss one?   Do you have signed POA/HPOA forms? Yes.                        If so, please bring to you appointment      Do you have any difficulty bathing or dressing yourself? No   Do you have any difficulty preparing food or eating? No   Do you have any difficulty managing your medications? No   Do you have any difficulty managing your finances? No   Do you have any difficulty affording your medications? No   Social Determinants of Radio broadcast assistant Strain: Not on file  Food Insecurity: Not on file  Transportation Needs: Not on file  Physical Activity: Not on file  Stress: Not on file  Social Connections: Not on file    Tobacco Counseling Counseling given: Not Answered   Clinical Intake:  Pre-visit preparation completed: Yes  Pain : No/denies pain     BMI - recorded: 21 Nutritional Status: BMI of 19-24  Normal Diabetes: No  How often do you need to have someone help you when you read instructions, pamphlets,  or other written materials from your doctor or pharmacy?: 1 - Never  Diabetic?no         Activities of Daily  Living In your present state of health, do you have any difficulty performing the following activities: 03/21/2021 06/05/2020  Hearing? N N  Vision? N N  Difficulty concentrating or making decisions? N N  Walking or climbing stairs? N N  Dressing or bathing? N N  Doing errands, shopping? N -  Preparing Food and eating ? N -  Using the Toilet? N -  In the past six months, have you accidently leaked urine? N -  Do you have problems with loss of bowel control? N -  Managing your Medications? N -  Managing your Finances? N -  Housekeeping or managing your Housekeeping? N -  Some recent data might be hidden    Patient Care Team: Lauree Chandler, NP as PCP - General (Geriatric Medicine)  Indicate any recent Medical Services you may have received from other than [facility] providers in the past year (date may be approximate).     Assessment:   This is a routine wellness examination for [place].  Hearing/Vision screen Hearing Screening - Comments:: Patent wears hearing problems. Vision Screening - Comments:: Patient has no vision problems. Patient has had yearly eye exam. Patient can't rmember eye doctor's name.  Dietary issues and exercise activities discussed: Current Exercise Habits: Structured exercise class;Home exercise routine, Type of exercise: yoga;calisthenics, Time (Minutes): 55, Frequency (Times/Week): 2, Weekly Exercise (Minutes/Week): 110   Goals Addressed   None    Depression Screen PHQ 2/9 Scores 03/21/2021  PHQ - 2 Score 0    Fall Risk Fall Risk  03/21/2021 02/05/2021 10/08/2020  Falls in the past year? 0 0 0  Number falls in past yr: 0 0 0  Injury with Fall? 0 0 0  Risk for fall due to : No Fall Risks No Fall Risks -  Follow up Falls evaluation completed Falls evaluation completed -    FALL RISK PREVENTION PERTAINING TO THE HOME:  Any stairs in or around the home? No  If so, are there any without handrails? No  Home free of loose throw rugs in walkways, pet  beds, electrical cords, etc? Yes  Adequate lighting in your home to reduce risk of falls? Yes   ASSISTIVE DEVICES UTILIZED TO PREVENT FALLS:  Life alert? No  Use of a cane, walker or w/c? No  Grab bars in the bathroom? Yes  Shower chair or bench in shower? No  Elevated toilet seat or a handicapped toilet? Yes   TIMED UP AND GO:  Was the test performed? No .    Cognitive Function:     6CIT Screen 03/21/2021  What Year? 0 points  What month? 0 points  What time? 0 points  Count back from 20 0 points  Months in reverse 0 points  Repeat phrase 2 points  Total Score 2    Immunizations Immunization History  Administered Date(s) Administered   Influenza, High Dose Seasonal PF 01/22/2018   Influenza-Unspecified 03/14/2013, 12/14/2019   Moderna Sars-Covid-2 Vaccination 04/26/2019, 05/24/2019, 10/03/2020   Pneumococcal Conjugate-13 04/24/2015   Td 05/18/2018   Tdap 05/18/2018, 09/07/2020   Zoster Recombinat (Shingrix) 10/29/2020    TDAP status: Up to date  Flu Vaccine status: Up to date  Pneumococcal vaccine status: Up to date  Covid-19 vaccine status: Information provided on how to obtain vaccines.   Qualifies for Shingles Vaccine? Yes   Zostavax completed  Yes   Shingrix Completed?: Yes  Screening Tests Health Maintenance  Topic Date Due   Pneumonia Vaccine 51+ Years old (2 - PPSV23 if available, else PCV20) 04/23/2016   INFLUENZA VACCINE  11/12/2020   COVID-19 Vaccine (4 - Booster for Moderna series) 11/28/2020   Zoster Vaccines- Shingrix (2 of 2) 12/24/2020   TETANUS/TDAP  09/08/2030   DEXA SCAN  Completed   HPV VACCINES  Aged Out    Health Maintenance  Health Maintenance Due  Topic Date Due   Pneumonia Vaccine 71+ Years old (2 - PPSV23 if available, else PCV20) 04/23/2016   INFLUENZA VACCINE  11/12/2020   COVID-19 Vaccine (4 - Booster for Moderna series) 11/28/2020   Zoster Vaccines- Shingrix (2 of 2) 12/24/2020    Colorectal cancer screening: No  longer required.   Mammogram status: No longer required due to age.  Pt declines bone density   Lung Cancer Screening: (Low Dose CT Chest recommended if Age 64-80 years, 30 pack-year currently smoking OR have quit w/in 15years.) does not qualify.   Lung Cancer Screening Referral: na  Additional Screening:  Hepatitis C Screening: does not qualify; Completed na  Vision Screening: Recommended annual ophthalmology exams for early detection of glaucoma and other disorders of the eye. Is the patient up to date with their annual eye exam?  Yes  Who is the provider or what is the name of the office in which the patient attends annual eye exams? Does not remember If pt is not established with a provider, would they like to be referred to a provider to establish care? No .   Dental Screening: Recommended annual dental exams for proper oral hygiene  Community Resource Referral / Chronic Care Management: CRR required this visit?  No   CCM required this visit?  No      Plan:     I have personally reviewed and noted the following in the patient's chart:   Medical and social history Use of alcohol, tobacco or illicit drugs  Current medications and supplements including opioid prescriptions.  Functional ability and status Nutritional status Physical activity Advanced directives List of other physicians Hospitalizations, surgeries, and ER visits in previous 12 months Vitals Screenings to include cognitive, depression, and falls Referrals and appointments  In addition, I have reviewed and discussed with patient certain preventive protocols, quality metrics, and best practice recommendations. A written personalized care plan for preventive services as well as general preventive health recommendations were provided to patient.     Lauree Chandler, NP   03/21/2021    Virtual Visit via Telephone Note  I connected withNAME@ on 03/21/21 at  2:15 PM EST by telephone and verified that I  am speaking with the correct person using two identifiers.  Location: Patient: home  Provider: [facility]    I discussed the limitations, risks, security and privacy concerns of performing an evaluation and management service by telephone and the availability of in person appointments. I also discussed with the patient that there may be a patient responsible charge related to this service. The patient expressed understanding and agreed to proceed.   I discussed the assessment and treatment plan with the patient. The patient was provided an opportunity to ask questions and all were answered. The patient agreed with the plan and demonstrated an understanding of the instructions.   The patient was advised to call back or seek an in-person evaluation if the symptoms worsen or if the condition fails to improve as anticipated.  I  provided 16 minutes of non-face-to-face time during this encounter.  Carlos American. Harle Battiest Avs printed and mailed

## 2021-03-21 NOTE — Telephone Encounter (Signed)
[name], kincade are scheduled for a virtual visit with your provider today.    Just as we do with appointments in the office, we must obtain your consent to participate.  Your consent will be active for this visit and any virtual visit you may have with one of our providers in the next 365 days.    If you have a MyChart account, I can also send a copy of this consent to you electronically.  All virtual visits are billed to your insurance company just like a traditional visit in the office.  As this is a virtual visit, video technology does not allow for your provider to perform a traditional examination.  This may limit your provider's ability to fully assess your condition.  If your provider identifies any concerns that need to be evaluated in person or the need to arrange testing such as labs, EKG, etc, we will make arrangements to do so.    Although advances in technology are sophisticated, we cannot ensure that it will always work on either your end or our end.  If the connection with a video visit is poor, we may have to switch to a telephone visit.  With either a video or telephone visit, we are not always able to ensure that we have a secure connection.   I need to obtain your verbal consent now.   Are you willing to proceed with your visit today?   Sharon Hood has provided verbal consent on 03/21/2021 for a virtual visit (video or telephone).   Skyles Kinds, CMA 03/21/2021  2:05 PM

## 2021-03-21 NOTE — Progress Notes (Signed)
This service is provided via telemedicine  No vital signs collected/recorded due to the encounter was a telemedicine visit.   Location of patient (ex: home, work):  Home  Patient consents to a telephone visit:  Yes, see encounter dated 03/21/2021  Location of the provider (ex: office, home):  [place]  Name of any referring provider:  N/A  Names of all persons participating in the telemedicine service and their role in the encounter:  Sherrie Mustache, Nurse Practitioner, [place] Kinds, CMA, and patient.   Time spent on call:  14 minutes with medical assistant

## 2021-06-12 ENCOUNTER — Other Ambulatory Visit: Payer: Self-pay | Admitting: Nurse Practitioner

## 2021-06-14 DIAGNOSIS — H353211 Exudative age-related macular degeneration, right eye, with active choroidal neovascularization: Secondary | ICD-10-CM | POA: Diagnosis not present

## 2021-07-01 ENCOUNTER — Ambulatory Visit: Payer: Medicare PPO | Admitting: Nurse Practitioner

## 2021-07-02 ENCOUNTER — Other Ambulatory Visit: Payer: Self-pay

## 2021-07-02 ENCOUNTER — Encounter: Payer: Self-pay | Admitting: Nurse Practitioner

## 2021-07-02 ENCOUNTER — Ambulatory Visit: Payer: Medicare PPO | Admitting: Nurse Practitioner

## 2021-07-02 VITALS — BP 130/78 | HR 67 | Temp 98.5°F | Ht 63.0 in | Wt 117.0 lb

## 2021-07-02 DIAGNOSIS — M810 Age-related osteoporosis without current pathological fracture: Secondary | ICD-10-CM

## 2021-07-02 DIAGNOSIS — J4520 Mild intermittent asthma, uncomplicated: Secondary | ICD-10-CM | POA: Diagnosis not present

## 2021-07-02 DIAGNOSIS — H353211 Exudative age-related macular degeneration, right eye, with active choroidal neovascularization: Secondary | ICD-10-CM | POA: Diagnosis not present

## 2021-07-02 DIAGNOSIS — M159 Polyosteoarthritis, unspecified: Secondary | ICD-10-CM

## 2021-07-02 DIAGNOSIS — H9113 Presbycusis, bilateral: Secondary | ICD-10-CM

## 2021-07-02 DIAGNOSIS — M150 Primary generalized (osteo)arthritis: Secondary | ICD-10-CM

## 2021-07-02 NOTE — Patient Instructions (Signed)
To get your pneumonia shot at your local pharmacy - Prevnar 20  ?

## 2021-07-02 NOTE — Progress Notes (Signed)
? ? ?Careteam: ?Patient Care Team: ?Lauree Chandler, NP as PCP - General (Geriatric Medicine) ? ?Advanced Directive information ?  ? ?Allergies  ?Allergen Reactions  ? Sulfa Antibiotics Nausea Only  ? ? ?Chief Complaint  ?Patient presents with  ? Medical Management of Chronic Issues  ?  6 month follow up.  ? Best Practice Recommendations  ?  Pneumonia vaccine, flu vaccine, 2nd COVID boostet, shingrix vaccine  ? ? ? ?HPI: Patient is a 88 y.o. female seen in today at the [facility] for for routine follow up.  ? ?Asthma- well controlled on advair twice daily. Also taking singulair  ? ?Doing well with allergy season at this time.  ? ?Followed closely by ophthalmology for macular degeneration.  ? ?Had flu shot.  ? ?Had area on her hand pop up, neighbor diagnosised with cyst. Now has completely resolved.  ? ?Involved in multiple exercise classes.  ? ? ?Review of Systems:  ?Review of Systems  ?Constitutional:  Negative for chills, fever and weight loss.  ?HENT:  Positive for hearing loss. Negative for tinnitus.   ?Respiratory:  Negative for cough, sputum production and shortness of breath.   ?Cardiovascular:  Negative for chest pain, palpitations and leg swelling.  ?Gastrointestinal:  Negative for abdominal pain, constipation, diarrhea and heartburn.  ?Genitourinary:  Negative for dysuria, frequency and urgency.  ?Musculoskeletal:  Positive for joint pain. Negative for back pain, falls and myalgias.  ?Skin: Negative.   ?Neurological:  Negative for dizziness and headaches.  ?Psychiatric/Behavioral:  Negative for depression and memory loss. The patient does not have insomnia.   ? ?Past Medical History:  ?Diagnosis Date  ? Arthritis   ? Asthma   ? Black-out (not amnesia) 2016  ? Cataract   ? OS  ? Vertigo   ? 2 episodes.  Most recent approx 2019  ? Wears hearing aid in both ears   ? ?Past Surgical History:  ?Procedure Laterality Date  ? APPENDECTOMY    ? CATARACT EXTRACTION Right   ? CATARACT EXTRACTION W/PHACO Left  06/05/2020  ? Procedure: CATARACT EXTRACTION PHACO AND INTRAOCULAR LENS PLACEMENT (IOC) LEFT 8.23 00:44.3;  Surgeon: Birder Robson, MD;  Location: [facility];  Service: Ophthalmology;  Laterality: Left;  ? EYE SURGERY Right 05/24/2018  ? Cat Sx  ? TONSILLECTOMY    ? 49 yr old  ? ?Social History: ?  reports that she quit smoking about 58 years ago. Her smoking use included cigarettes. She has never used smokeless tobacco. She reports current alcohol use. She reports that she does not use drugs. ? ?Family History  ?Problem Relation Age of Onset  ? Other Mother   ?     22 yrs old  ? Cancer Father   ? Asthma Brother   ? ? ?Medications: ?Patient's Medications  ?New Prescriptions  ? No medications on file  ?Previous Medications  ? ALBUTEROL (VENTOLIN HFA) 108 (90 BASE) MCG/ACT INHALER    Inhale into the lungs every 6 (six) hours as needed for wheezing or shortness of breath.  ? CALCIUM CARBONATE (OSCAL) 1500 (600 CA) MG TABS TABLET    Take 1,500 mg daily with breakfast by mouth.   ? FLUTICASONE-SALMETEROL (ADVAIR) 250-50 MCG/DOSE AEPB    Inhale 1 puff into the lungs 2 (two) times daily as needed.  ? KETOCONAZOLE (NIZORAL) 2 % SHAMPOO    APPLY 1 APPLICATION TOPICALLY 2 (TWO) TIMES A WEEK.  ? MONTELUKAST (SINGULAIR) 10 MG TABLET    Take 10 mg every  morning by mouth.   ?Modified Medications  ? No medications on file  ?Discontinued Medications  ? No medications on file  ? ? ?Physical Exam: ? ?Vitals:  ? 07/02/21 1019  ?BP: 130/78  ?Pulse: 67  ?Temp: 98.5 ?F (36.9 ?C)  ?SpO2: 98%  ?Weight: 117 lb (53.1 kg)  ?Height: '5\' 3"'$  (1.6 m)  ? ?Body mass index is 20.73 kg/m?. ?Wt Readings from Last 3 Encounters:  ?07/02/21 117 lb (53.1 kg)  ?02/05/21 118 lb (53.5 kg)  ?09/27/20 119 lb 8 oz (54.2 kg)  ? ? ?Physical Exam ?Constitutional:   ?   General: She is not in acute distress. ?   Appearance: She is well-developed. She is not diaphoretic.  ?HENT:  ?   Head: Normocephalic and atraumatic.  ?   Mouth/Throat:  ?   Pharynx: No  oropharyngeal exudate.  ?Eyes:  ?   Conjunctiva/sclera: Conjunctivae normal.  ?   Pupils: Pupils are equal, round, and reactive to light.  ?Cardiovascular:  ?   Rate and Rhythm: Normal rate and regular rhythm.  ?   Heart sounds: Normal heart sounds.  ?Pulmonary:  ?   Effort: Pulmonary effort is normal.  ?   Breath sounds: Normal breath sounds.  ?Abdominal:  ?   General: Bowel sounds are normal.  ?   Palpations: Abdomen is soft.  ?Musculoskeletal:  ?   Cervical back: Normal range of motion and neck supple.  ?   Right lower leg: No edema.  ?   Left lower leg: No edema.  ?Skin: ?   General: Skin is warm and dry.  ?Neurological:  ?   Mental Status: She is alert.  ?Psychiatric:     ?   Mood and Affect: Mood normal.  ? ? ?Labs reviewed: ?Basic Metabolic Panel: ?Recent Labs  ?  08/13/20 ?0000 01/30/21 ?0000 01/31/21 ?0000  ?NA 134*  --  137  ?K 4.5  --  4.6  ?CL 100  --  102  ?CO2 31*  --  27*  ?BUN 10  --  11  ?CREATININE 0.7  --  0.8  ?CALCIUM  --  9.6  --   ? ?Liver Function Tests: ?Recent Labs  ?  08/13/20 ?0000 01/30/21 ?0000 01/31/21 ?0000  ?AST 41*  --  30  ?ALT 25  --  13  ?ALKPHOS 77  --  58  ?ALBUMIN  --  3.9  --   ? ?No results for input(s): LIPASE, AMYLASE in the last 8760 hours. ?No results for input(s): AMMONIA in the last 8760 hours. ?CBC: ?Recent Labs  ?  08/13/20 ?0000 01/31/21 ?0000  ?WBC 6.6 6.3  ?NEUTROABS  --  2,596.00  ?HGB 14.1 14.5  ?HCT 43 44  ?PLT 349 319  ? ?Lipid Panel: ?No results for input(s): CHOL, HDL, LDLCALC, TRIG, CHOLHDL, LDLDIRECT in the last 8760 hours. ?TSH: ?No results for input(s): TSH in the last 8760 hours. ?A1C: ?No results found for: HGBA1C ? ? ?Assessment/Plan ?1. Exudative age-related macular degeneration of right eye with active choroidal neovascularization ([place]) ?Continues to follow up closely with ophthalmology ?No major changes at this time ? ?2. Primary osteoarthritis involving multiple joints ?Continues with lifestyle modifications, strength training. ? ?3. Age-related  osteoporosis without current pathological fracture ?Recommended to take calcium 600 mg twice daily with Vitamin D 2000 units daily and weight bearing activity 30 mins/5 days a week ? ?4. Mild intermittent asthma without complication ?-controlled on advair and singulair.  ? ?5. Presbycusis of both ears ?-  stable, wearing hearing aids which benefit her.  ? ? ?Next appt: 6 months.  ?Carlos American. Dewaine Oats, AGNP ? ?[facility] ?[phone]  ?

## 2021-07-03 ENCOUNTER — Telehealth: Payer: Self-pay

## 2021-07-03 NOTE — Telephone Encounter (Signed)
Patient called stating that she saw Lauree Chandler, NP  yesterday and was told to call regarding her Influenza vaccine and stated that she got her pneumonia vaccine today 07/03/2021.  ? ?Both vaccines were abstracted in the patients chart.  ?

## 2021-07-23 ENCOUNTER — Encounter: Payer: Self-pay | Admitting: Nurse Practitioner

## 2021-07-23 ENCOUNTER — Ambulatory Visit: Payer: Medicare PPO | Admitting: Nurse Practitioner

## 2021-07-23 VITALS — BP 130/90 | HR 89 | Temp 98.6°F | Ht 63.0 in | Wt 115.0 lb

## 2021-07-23 DIAGNOSIS — J209 Acute bronchitis, unspecified: Secondary | ICD-10-CM | POA: Diagnosis not present

## 2021-07-23 NOTE — Progress Notes (Signed)
? ? ?Careteam: ?Patient Care Team: ?Lauree Chandler, NP as PCP - General (Geriatric Medicine) ? ?Advanced Directive information ?  ? ?Allergies  ?Allergen Reactions  ? Sulfa Antibiotics Nausea Only  ? ? ?Chief Complaint  ?Patient presents with  ? Acute Visit  ?  Patient complains of having cough. Patient has had cough for about 10 days. Patient has had no energy. No fever. Phlegm has been green, yellow. Patient has had some back pain. This past 'Sunday she had diarrhea. Patient has not had good appetite. Started with severe sore throat.   ? ? ? ?HPI: Patient is a 88 y.o. female seen in today at the [facility] for not feeling well.  ?She has had no fever. Increase fatigue.  ?Green sputum went to gold and now white/clear.  ?She has woken up with a back ache  ?Cough, worse when she lays down. Chest congestion ?No nasal congestion ?No headaches.  ?Severe sore throat that has improved.  ?She is napping on couch with head propped up.  ?She had diarrhea one day for several episodes- this has resolved.  ?Has not taken anything over the counter.  ?It bothers her that she feels fatigue.  ?Did not take COVID.  ? ? ?Review of Systems:  ?Review of Systems  ?Constitutional:  Positive for malaise/fatigue. Negative for chills and fever.  ?HENT:  Negative for congestion, ear pain, hearing loss and sore throat.   ?Respiratory:  Positive for cough and sputum production. Negative for shortness of breath and wheezing.   ?Cardiovascular:  Negative for chest pain and palpitations.  ?Neurological:  Negative for dizziness and headaches.  ? ?Past Medical History:  ?Diagnosis Date  ? Arthritis   ? Asthma   ? Black-out (not amnesia) 2016  ? Cataract   ? OS  ? Vertigo   ? 2 episodes.  Most recent approx 2019  ? Wears hearing aid in both ears   ? ?Past Surgical History:  ?Procedure Laterality Date  ? APPENDECTOMY    ? CATARACT EXTRACTION Right   ? CATARACT EXTRACTION W/PHACO Left 06/05/2020  ? Procedure: CATARACT EXTRACTION PHACO AND  INTRAOCULAR LENS PLACEMENT (IOC) LEFT 8.23 00:44.3;  Surgeon: Porfilio, William, MD;  Location: [facility];  Service: Ophthalmology;  Laterality: Left;  ? EYE SURGERY Right 05/24/2018  ? Cat Sx  ? TONSILLECTOMY    ? 8 yr old  ? ?Social History: ?  reports that she quit smoking about 58 years ago. Her smoking use included cigarettes. She has never used smokeless tobacco. She reports current alcohol use. She reports that she does not use drugs. ? ?Family History  ?Problem Relation Age of Onset  ? Other Mother   ?     10'$ 1 yrs old  ? Cancer Father   ? Asthma Brother   ? ? ?Medications: ?Patient's Medications  ?New Prescriptions  ? No medications on file  ?Previous Medications  ? ALBUTEROL (VENTOLIN HFA) 108 (90 BASE) MCG/ACT INHALER    Inhale into the lungs every 6 (six) hours as needed for wheezing or shortness of breath.  ? CALCIUM CARBONATE (OSCAL) 1500 (600 CA) MG TABS TABLET    Take 1,500 mg daily with breakfast by mouth.   ? FLUTICASONE-SALMETEROL (ADVAIR) 250-50 MCG/DOSE AEPB    Inhale 1 puff into the lungs 2 (two) times daily as needed.  ? KETOCONAZOLE (NIZORAL) 2 % SHAMPOO    APPLY 1 APPLICATION TOPICALLY 2 (TWO) TIMES A WEEK.  ? MONTELUKAST (SINGULAIR) 10 MG TABLET  Take 10 mg every morning by mouth.   ?Modified Medications  ? No medications on file  ?Discontinued Medications  ? No medications on file  ? ? ?Physical Exam: ? ?Vitals:  ? 07/23/21 0914  ?BP: 130/90  ?Pulse: 89  ?Temp: 98.6 ?F (37 ?C)  ?SpO2: 97%  ?Weight: 115 lb (52.2 kg)  ?Height: '5\' 3"'$  (1.6 m)  ? ?Body mass index is 20.37 kg/m?. ?Wt Readings from Last 3 Encounters:  ?07/23/21 115 lb (52.2 kg)  ?07/02/21 117 lb (53.1 kg)  ?02/05/21 118 lb (53.5 kg)  ? ? ?Physical Exam ?Constitutional:   ?   General: She is not in acute distress. ?   Appearance: She is well-developed. She is not diaphoretic.  ?HENT:  ?   Head: Normocephalic and atraumatic.  ?   Mouth/Throat:  ?   Pharynx: No oropharyngeal exudate.  ?Eyes:  ?   Conjunctiva/sclera:  Conjunctivae normal.  ?   Pupils: Pupils are equal, round, and reactive to light.  ?Cardiovascular:  ?   Rate and Rhythm: Normal rate and regular rhythm.  ?   Heart sounds: Normal heart sounds.  ?Pulmonary:  ?   Effort: Pulmonary effort is normal.  ?   Breath sounds: Normal breath sounds.  ?Abdominal:  ?   General: Bowel sounds are normal.  ?   Palpations: Abdomen is soft.  ?Musculoskeletal:  ?   Cervical back: Normal range of motion and neck supple.  ?   Right lower leg: No edema.  ?   Left lower leg: No edema.  ?Skin: ?   General: Skin is warm and dry.  ?Neurological:  ?   General: No focal deficit present.  ?   Mental Status: She is alert and oriented to person, place, and time. Mental status is at baseline.  ?Psychiatric:     ?   Mood and Affect: Mood normal.  ? ? ?Labs reviewed: ?Basic Metabolic Panel: ?Recent Labs  ?  08/13/20 ?0000 01/30/21 ?0000 01/31/21 ?0000  ?NA 134*  --  137  ?K 4.5  --  4.6  ?CL 100  --  102  ?CO2 31*  --  27*  ?BUN 10  --  11  ?CREATININE 0.7  --  0.8  ?CALCIUM  --  9.6  --   ? ?Liver Function Tests: ?Recent Labs  ?  08/13/20 ?0000 01/30/21 ?0000 01/31/21 ?0000  ?AST 41*  --  30  ?ALT 25  --  13  ?ALKPHOS 77  --  58  ?ALBUMIN  --  3.9  --   ? ?No results for input(s): LIPASE, AMYLASE in the last 8760 hours. ?No results for input(s): AMMONIA in the last 8760 hours. ?CBC: ?Recent Labs  ?  08/13/20 ?0000 01/31/21 ?0000  ?WBC 6.6 6.3  ?NEUTROABS  --  2,596.00  ?HGB 14.1 14.5  ?HCT 43 44  ?PLT 349 319  ? ?Lipid Panel: ?No results for input(s): CHOL, HDL, LDLCALC, TRIG, CHOLHDL, LDLDIRECT in the last 8760 hours. ?TSH: ?No results for input(s): TSH in the last 8760 hours. ?A1C: ?No results found for: HGBA1C ? ? ?Assessment/Plan ?1. Acute bronchitis, unspecified organism ?Overall slowly improving, supportive care at this time ?Plain nasal saline spray throughout the day as needed for nasal congestion ?May use tylenol 500 mg 2 tablets every 8 hours as needed aches and pains or sore  throat ?humidifier in the home to help with the dry air ?Mucinex DM by mouth twice daily as needed for cough and chest congestion  with full glass of water  ?Keep well hydrated ?Proper nutrition  ?Vit c 1000 mg daily ?Vit d 2000 units daily  ?-to notify for worsening of symptoms, fever, chills, shortness of breath, decrease LOC, increase lethargy.  ? ?Carlos American. Dewaine Oats, AGNP ? ?[facility] ?[phone]  ?

## 2021-07-23 NOTE — Patient Instructions (Signed)
?  May use tylenol 500 mg 2 tablets every 8 hours as needed aches and pains or sore throat ?humidifier in the home to help with the dry air ?Mucinex DM by mouth twice daily as needed for cough and chest congestion with full glass of water  ?Keep well hydrated ?Proper nutrition  ?Vit c 1000 mg twice daily to boost immunity ?Vit d 2000 units daily  ? ? ?

## 2021-07-29 ENCOUNTER — Telehealth: Payer: Self-pay | Admitting: *Deleted

## 2021-07-29 NOTE — Telephone Encounter (Signed)
If her symptoms have improved she should be able to go about her regular scheduled events.  ?

## 2021-07-29 NOTE — Telephone Encounter (Signed)
LMOM to return call.

## 2021-07-29 NOTE — Telephone Encounter (Signed)
Patient called and stated that she was seen on 07/23/2021. Patient wants to know: ? ?1.) if she is still Contagious? ? ?2.) If it is alright to do what she feels like doing? ? ?Please Advise.  ?

## 2021-07-29 NOTE — Telephone Encounter (Signed)
Patient notified and agreed.  

## 2021-09-11 DIAGNOSIS — M1711 Unilateral primary osteoarthritis, right knee: Secondary | ICD-10-CM | POA: Diagnosis not present

## 2021-10-21 ENCOUNTER — Other Ambulatory Visit: Payer: Self-pay | Admitting: Nurse Practitioner

## 2021-12-11 DIAGNOSIS — H353211 Exudative age-related macular degeneration, right eye, with active choroidal neovascularization: Secondary | ICD-10-CM | POA: Diagnosis not present

## 2022-01-06 DIAGNOSIS — M810 Age-related osteoporosis without current pathological fracture: Secondary | ICD-10-CM | POA: Diagnosis not present

## 2022-01-06 DIAGNOSIS — M159 Polyosteoarthritis, unspecified: Secondary | ICD-10-CM | POA: Diagnosis not present

## 2022-01-06 DIAGNOSIS — J4520 Mild intermittent asthma, uncomplicated: Secondary | ICD-10-CM | POA: Diagnosis not present

## 2022-01-06 LAB — BASIC METABOLIC PANEL
BUN: 12 (ref 4–21)
CO2: 31 — AB (ref 13–22)
Chloride: 98 — AB (ref 99–108)
Creatinine: 0.7 (ref 0.5–1.1)
Glucose: 101
Potassium: 4.4 mEq/L (ref 3.5–5.1)
Sodium: 133 — AB (ref 137–147)

## 2022-01-06 LAB — HEPATIC FUNCTION PANEL
ALT: 18 U/L (ref 7–35)
AST: 33 (ref 13–35)
Alkaline Phosphatase: 55 (ref 25–125)
Bilirubin, Total: 0.7

## 2022-01-06 LAB — CBC AND DIFFERENTIAL
HCT: 42 (ref 36–46)
Hemoglobin: 14.1 (ref 12.0–16.0)
Platelets: 356 10*3/uL (ref 150–400)
WBC: 6.2

## 2022-01-06 LAB — COMPREHENSIVE METABOLIC PANEL
Albumin: 4.3 (ref 3.5–5.0)
Calcium: 9.6 (ref 8.7–10.7)
Globulin: 2.4

## 2022-01-06 LAB — CBC: RBC: 4.63 (ref 3.87–5.11)

## 2022-01-07 ENCOUNTER — Encounter: Payer: Self-pay | Admitting: Nurse Practitioner

## 2022-01-07 ENCOUNTER — Ambulatory Visit: Payer: Medicare PPO | Admitting: Nurse Practitioner

## 2022-01-07 VITALS — BP 122/70 | HR 81 | Temp 98.4°F | Ht 63.0 in | Wt 118.0 lb

## 2022-01-07 DIAGNOSIS — R600 Localized edema: Secondary | ICD-10-CM

## 2022-01-07 DIAGNOSIS — E871 Hypo-osmolality and hyponatremia: Secondary | ICD-10-CM

## 2022-01-07 DIAGNOSIS — H9113 Presbycusis, bilateral: Secondary | ICD-10-CM

## 2022-01-07 DIAGNOSIS — M150 Primary generalized (osteo)arthritis: Secondary | ICD-10-CM

## 2022-01-07 DIAGNOSIS — M810 Age-related osteoporosis without current pathological fracture: Secondary | ICD-10-CM | POA: Diagnosis not present

## 2022-01-07 DIAGNOSIS — M159 Polyosteoarthritis, unspecified: Secondary | ICD-10-CM | POA: Diagnosis not present

## 2022-01-07 DIAGNOSIS — J4520 Mild intermittent asthma, uncomplicated: Secondary | ICD-10-CM

## 2022-01-07 DIAGNOSIS — I872 Venous insufficiency (chronic) (peripheral): Secondary | ICD-10-CM

## 2022-01-07 NOTE — Progress Notes (Signed)
Careteam: Patient Care Team: Lauree Chandler, NP as PCP - General (Geriatric Medicine)  Advanced Directive information Does Patient Have a Medical Advance Directive?: Yes, Type of Advance Directive: [place];Living will, Does patient want to make changes to medical advance directive?: No - Patient declined  Allergies  Allergen Reactions   Sulfa Antibiotics Nausea Only    Chief Complaint  Patient presents with   Medical Management of Chronic Issues    6 month follow-up. Discuss need for flu vaccine or post pone if patient refuses (patient plans to get when facility offers). [facility] verified.      HPI: Patient is a 88 y.o. female seen in today at the [facility] for routine follow up She is doing well. Planning on getting flu vaccine when facility offers in October.   She has no complaints today  Allergies- taking Singulair 10 mg daily  Asthma- rarely uses inhalers.   Continues on cal and vit d   Review of Systems:  Review of Systems  Constitutional:  Negative for chills, fever and weight loss.  HENT:  Negative for tinnitus.   Respiratory:  Negative for cough, sputum production and shortness of breath.   Cardiovascular:  Negative for chest pain, palpitations and leg swelling.  Gastrointestinal:  Negative for abdominal pain, constipation, diarrhea and heartburn.  Genitourinary:  Negative for dysuria, frequency and urgency.  Musculoskeletal:  Negative for back pain, falls, joint pain and myalgias.  Skin: Negative.   Neurological:  Negative for dizziness and headaches.  Psychiatric/Behavioral:  Negative for depression and memory loss. The patient does not have insomnia.     Past Medical History:  Diagnosis Date   Arthritis    Asthma    Black-out (not amnesia) 2016   Cataract    OS   Vertigo    2 episodes.  Most recent approx 2019   Wears hearing aid in both ears    Past Surgical History:  Procedure Laterality Date   APPENDECTOMY      CATARACT EXTRACTION Right    CATARACT EXTRACTION W/PHACO Left 06/05/2020   Procedure: CATARACT EXTRACTION PHACO AND INTRAOCULAR LENS PLACEMENT (IOC) LEFT 8.23 00:44.3;  Surgeon: Birder Robson, MD;  Location: [facility];  Service: Ophthalmology;  Laterality: Left;   EYE SURGERY Right 05/24/2018   Cat Sx   TONSILLECTOMY     24 yr old   Social History:   reports that she quit smoking about 59 years ago. Her smoking use included cigarettes. She has never used smokeless tobacco. She reports current alcohol use. She reports that she does not use drugs.  Family History  Problem Relation Age of Onset   Other Mother        15 yrs old   Cancer Father    Asthma Brother     Medications: Patient's Medications  New Prescriptions   No medications on file  Previous Medications   ALBUTEROL (VENTOLIN HFA) 108 (90 BASE) MCG/ACT INHALER    Inhale into the lungs every 6 (six) hours as needed for wheezing or shortness of breath.   CALCIUM CARBONATE (OSCAL) 1500 (600 CA) MG TABS TABLET    Take 1,500 mg daily with breakfast by mouth.    FLUTICASONE-SALMETEROL (ADVAIR) 250-50 MCG/DOSE AEPB    Inhale 1 puff into the lungs 2 (two) times daily as needed.   KETOCONAZOLE (NIZORAL) 2 % SHAMPOO    APPLY 1 APPLICATION TOPICALLY 2 (TWO) TIMES A WEEK.   MONTELUKAST (SINGULAIR) 10 MG TABLET  Take 10 mg every morning by mouth.   Modified Medications   No medications on file  Discontinued Medications   No medications on file    Physical Exam:  Vitals:   01/07/22 0934  BP: 122/70  Pulse: 81  Temp: 98.4 F (36.9 C)  TempSrc: Temporal  SpO2: 98%  Weight: 118 lb (53.5 kg)  Height: '5\' 3"'$  (1.6 m)   Body mass index is 20.9 kg/m. Wt Readings from Last 3 Encounters:  01/07/22 118 lb (53.5 kg)  07/23/21 115 lb (52.2 kg)  07/02/21 117 lb (53.1 kg)    Physical Exam Constitutional:      General: She is not in acute distress.    Appearance: She is well-developed. She is not diaphoretic.   HENT:     Head: Normocephalic and atraumatic.     Mouth/Throat:     Pharynx: No oropharyngeal exudate.  Eyes:     Conjunctiva/sclera: Conjunctivae normal.     Pupils: Pupils are equal, round, and reactive to light.  Cardiovascular:     Rate and Rhythm: Normal rate and regular rhythm.     Heart sounds: Normal heart sounds.  Pulmonary:     Effort: Pulmonary effort is normal.     Breath sounds: Normal breath sounds.  Abdominal:     General: Bowel sounds are normal.     Palpations: Abdomen is soft.  Musculoskeletal:     Cervical back: Normal range of motion and neck supple.     Right lower leg: No edema.     Left lower leg: No edema.  Skin:    General: Skin is warm and dry.  Neurological:     Mental Status: She is alert and oriented to person, place, and time. Mental status is at baseline.  Psychiatric:        Mood and Affect: Mood normal.     Labs reviewed: Basic Metabolic Panel: Recent Labs    01/30/21 0000 01/31/21 0000 01/06/22 0000  NA  --  137 133*  K  --  4.6 4.4  CL  --  102 98*  CO2  --  27* 31*  BUN  --  11 12  CREATININE  --  0.8 0.7  CALCIUM 9.6  --  9.6   Liver Function Tests: Recent Labs    01/30/21 0000 01/31/21 0000 01/06/22 0000  AST  --  30 33  ALT  --  13 18  ALKPHOS  --  58 55  ALBUMIN 3.9  --  4.3   No results for input(s): "LIPASE", "AMYLASE" in the last 8760 hours. No results for input(s): "AMMONIA" in the last 8760 hours. CBC: Recent Labs    01/31/21 0000 01/06/22 0000  WBC 6.3 6.2  NEUTROABS 2,596.00  --   HGB 14.5 14.1  HCT 44 42  PLT 319 356   Lipid Panel: No results for input(s): "CHOL", "HDL", "LDLCALC", "TRIG", "CHOLHDL", "LDLDIRECT" in the last 8760 hours. TSH: No results for input(s): "TSH" in the last 8760 hours. A1C: No results found for: "HGBA1C"   Assessment/Plan 1. Primary osteoarthritis involving multiple joints -stable at this time, continues to stay active.   2. Age-related osteoporosis without  current pathological fracture -does not wish to be on medication. Recommended to take calcium 600 mg twice daily with Vitamin D 2000 units daily and weight bearing activity 30 mins/5 days a week.   3. Mild intermittent asthma without complication -well controlled at this time. No recent flares. Does not use inhaler routinely  4. Presbycusis of both ears -continues with hearing aides  5. Edema of left lower leg due to peripheral venous insufficiency --encouraged to elevate legs above level of heart as tolerates, compression hose as tolerates (on in am, off in pm)  6. Hyponatremia Marginally low, not on medication that could be causing, no excessive drinking water. No diarrhea noted. Will recheck in 4 weeks.    Next appt: 6 months for routine follow up.  Carlos American. [place], [facility] [phone]

## 2022-01-07 NOTE — Patient Instructions (Addendum)
To follow up with lab work at [facility]. On THURSDAY OCTOBER 26th at 7:30 to follow up your sodium level- sodium was slightly low and this is a recheck.   -encouraged to elevate legs above level of heart as tolerates, compression hose as tolerates (on in am, off in pm)

## 2022-01-10 DIAGNOSIS — L281 Prurigo nodularis: Secondary | ICD-10-CM | POA: Diagnosis not present

## 2022-01-10 DIAGNOSIS — D2271 Melanocytic nevi of right lower limb, including hip: Secondary | ICD-10-CM | POA: Diagnosis not present

## 2022-01-10 DIAGNOSIS — D225 Melanocytic nevi of trunk: Secondary | ICD-10-CM | POA: Diagnosis not present

## 2022-01-10 DIAGNOSIS — L821 Other seborrheic keratosis: Secondary | ICD-10-CM | POA: Diagnosis not present

## 2022-01-10 DIAGNOSIS — D2272 Melanocytic nevi of left lower limb, including hip: Secondary | ICD-10-CM | POA: Diagnosis not present

## 2022-01-10 DIAGNOSIS — C44729 Squamous cell carcinoma of skin of left lower limb, including hip: Secondary | ICD-10-CM | POA: Diagnosis not present

## 2022-01-10 DIAGNOSIS — D2261 Melanocytic nevi of right upper limb, including shoulder: Secondary | ICD-10-CM | POA: Diagnosis not present

## 2022-01-10 DIAGNOSIS — D2262 Melanocytic nevi of left upper limb, including shoulder: Secondary | ICD-10-CM | POA: Diagnosis not present

## 2022-01-10 DIAGNOSIS — D485 Neoplasm of uncertain behavior of skin: Secondary | ICD-10-CM | POA: Diagnosis not present

## 2022-02-10 DIAGNOSIS — E871 Hypo-osmolality and hyponatremia: Secondary | ICD-10-CM | POA: Diagnosis not present

## 2022-02-10 LAB — BASIC METABOLIC PANEL WITH GFR
BUN: 11 (ref 4–21)
CO2: 25 — AB (ref 13–22)
Chloride: 98 — AB (ref 99–108)
Creatinine: 0.7 (ref 0.5–1.1)
Glucose: 111
Potassium: 4.3 meq/L (ref 3.5–5.1)
Sodium: 135 — AB (ref 137–147)

## 2022-02-10 LAB — COMPREHENSIVE METABOLIC PANEL
Calcium: 9.2 (ref 8.7–10.7)
eGFR: 79

## 2022-02-11 ENCOUNTER — Encounter: Payer: Self-pay | Admitting: Nurse Practitioner

## 2022-02-12 DIAGNOSIS — C44729 Squamous cell carcinoma of skin of left lower limb, including hip: Secondary | ICD-10-CM | POA: Diagnosis not present

## 2022-02-12 DIAGNOSIS — D2372 Other benign neoplasm of skin of left lower limb, including hip: Secondary | ICD-10-CM | POA: Diagnosis not present

## 2022-02-13 ENCOUNTER — Telehealth: Payer: Self-pay | Admitting: Nurse Practitioner

## 2022-02-13 NOTE — Telephone Encounter (Signed)
-----   Message from Albertina Sen[place] May, [place] sent at 02/11/2022 11:08 AM EDT ----- Abstracted to chart

## 2022-02-13 NOTE — Telephone Encounter (Signed)
Please call pt and let her know Her electrolytes, glucose, and kidney function are normal Please see media tab for labs

## 2022-02-13 NOTE — Telephone Encounter (Signed)
Patient notified and agreed.  

## 2022-03-12 DIAGNOSIS — M1711 Unilateral primary osteoarthritis, right knee: Secondary | ICD-10-CM | POA: Diagnosis not present

## 2022-03-27 ENCOUNTER — Encounter: Payer: Medicare PPO | Admitting: Nurse Practitioner

## 2022-06-09 DIAGNOSIS — H353211 Exudative age-related macular degeneration, right eye, with active choroidal neovascularization: Secondary | ICD-10-CM | POA: Diagnosis not present

## 2022-06-09 DIAGNOSIS — L814 Other melanin hyperpigmentation: Secondary | ICD-10-CM | POA: Diagnosis not present

## 2022-06-09 DIAGNOSIS — L720 Epidermal cyst: Secondary | ICD-10-CM | POA: Diagnosis not present

## 2022-06-09 DIAGNOSIS — L821 Other seborrheic keratosis: Secondary | ICD-10-CM | POA: Diagnosis not present

## 2022-06-09 DIAGNOSIS — L820 Inflamed seborrheic keratosis: Secondary | ICD-10-CM | POA: Diagnosis not present

## 2022-07-15 ENCOUNTER — Encounter: Payer: Self-pay | Admitting: Nurse Practitioner

## 2022-07-15 ENCOUNTER — Ambulatory Visit (INDEPENDENT_AMBULATORY_CARE_PROVIDER_SITE_OTHER): Payer: Medicare PPO | Admitting: Nurse Practitioner

## 2022-07-15 VITALS — BP 124/82 | HR 67 | Temp 97.9°F | Ht 63.0 in | Wt 120.0 lb

## 2022-07-15 DIAGNOSIS — Z0000 Encounter for general adult medical examination without abnormal findings: Secondary | ICD-10-CM | POA: Diagnosis not present

## 2022-07-15 NOTE — Patient Instructions (Signed)
Sharon Hood , Thank you for taking time to come for your Medicare Wellness Visit. I appreciate your ongoing commitment to your health goals. Please review the following plan we discussed and let me know if I can assist you in the future.   Screening recommendations/referrals: Colonoscopy aged out Mammogram aged out Bone Density declined Recommended yearly ophthalmology/optometry visit for glaucoma screening and checkup Recommended yearly dental visit for hygiene and checkup  Vaccinations: Influenza vaccine- due annually in September/October Pneumococcal vaccine up to date Tdap vaccine up to date Shingles vaccine up to date    Advanced directives: BRING TO NEXT FOLLOW UP  Conditions/risks identified: advanced age.  Next appointment: yearly    Preventive Care 87 Years and Older, Female Preventive care refers to lifestyle choices and visits with your health care provider that can promote health and wellness. What does preventive care include? A yearly physical exam. This is also called an annual well check. Dental exams once or twice a year. Routine eye exams. Ask your health care provider how often you should have your eyes checked. Personal lifestyle choices, including: Daily care of your teeth and gums. Regular physical activity. Eating a healthy diet. Avoiding tobacco and drug use. Limiting alcohol use. Practicing safe sex. Taking low-dose aspirin every day. Taking vitamin and mineral supplements as recommended by your health care provider. What happens during an annual well check? The services and screenings done by your health care provider during your annual well check will depend on your age, overall health, lifestyle risk factors, and family history of disease. Counseling  Your health care provider may ask you questions about your: Alcohol use. Tobacco use. Drug use. Emotional well-being. Home and relationship well-being. Sexual activity. Eating habits. History  of falls. Memory and ability to understand (cognition). Work and work Statistician. Reproductive health. Screening  You may have the following tests or measurements: Height, weight, and BMI. Blood pressure. Lipid and cholesterol levels. These may be checked every 5 years, or more frequently if you are over 64 years old. Skin check. Lung cancer screening. You may have this screening every year starting at age 47 if you have a 30-pack-year history of smoking and currently smoke or have quit within the past 15 years. Fecal occult blood test (FOBT) of the stool. You may have this test every year starting at age 36. Flexible sigmoidoscopy or colonoscopy. You may have a sigmoidoscopy every 5 years or a colonoscopy every 10 years starting at age 81. Hepatitis C blood test. Hepatitis B blood test. Sexually transmitted disease (STD) testing. Diabetes screening. This is done by checking your blood sugar (glucose) after you have not eaten for a while (fasting). You may have this done every 1-3 years. Bone density scan. This is done to screen for osteoporosis. You may have this done starting at age 15. Mammogram. This may be done every 1-2 years. Talk to your health care provider about how often you should have regular mammograms. Talk with your health care provider about your test results, treatment options, and if necessary, the need for more tests. Vaccines  Your health care provider may recommend certain vaccines, such as: Influenza vaccine. This is recommended every year. Tetanus, diphtheria, and acellular pertussis (Tdap, Td) vaccine. You may need a Td booster every 10 years. Zoster vaccine. You may need this after age 65. Pneumococcal 13-valent conjugate (PCV13) vaccine. One dose is recommended after age 16. Pneumococcal polysaccharide (PPSV23) vaccine. One dose is recommended after age 65. Talk to your health care provider  about which screenings and vaccines you need and how often you need  them. This information is not intended to replace advice given to you by your health care provider. Make sure you discuss any questions you have with your health care provider. Document Released: 04/27/2015 Document Revised: 12/19/2015 Document Reviewed: 01/30/2015 Elsevier Interactive Patient Education  2017 [place] Prevention in the Home Falls can cause injuries. They can happen to people of all ages. There are many things you can do to make your home safe and to help prevent falls. What can I do on the outside of my home? Regularly fix the edges of walkways and driveways and fix any cracks. Remove anything that might make you trip as you walk through a door, such as a raised step or threshold. Trim any bushes or trees on the path to your home. Use bright outdoor lighting. Clear any walking paths of anything that might make someone trip, such as rocks or tools. Regularly check to see if handrails are loose or broken. Make sure that both sides of any steps have handrails. Any raised decks and porches should have guardrails on the edges. Have any leaves, snow, or ice cleared regularly. Use sand or salt on walking paths during winter. Clean up any spills in your garage right away. This includes oil or grease spills. What can I do in the bathroom? Use night lights. Install grab bars by the toilet and in the tub and shower. Do not use towel bars as grab bars. Use non-skid mats or decals in the tub or shower. If you need to sit down in the shower, use a plastic, non-slip stool. Keep the floor dry. Clean up any water that spills on the floor as soon as it happens. Remove soap buildup in the tub or shower regularly. Attach bath mats securely with double-sided non-slip rug tape. Do not have throw rugs and other things on the floor that can make you trip. What can I do in the bedroom? Use night lights. Make sure that you have a light by your bed that is easy to reach. Do not use  any sheets or blankets that are too big for your bed. They should not hang down onto the floor. Have a firm chair that has side arms. You can use this for support while you get dressed. Do not have throw rugs and other things on the floor that can make you trip. What can I do in the kitchen? Clean up any spills right away. Avoid walking on wet floors. Keep items that you use a lot in easy-to-reach places. If you need to reach something above you, use a strong step stool that has a grab bar. Keep electrical cords out of the way. Do not use floor polish or wax that makes floors slippery. If you must use wax, use non-skid floor wax. Do not have throw rugs and other things on the floor that can make you trip. What can I do with my stairs? Do not leave any items on the stairs. Make sure that there are handrails on both sides of the stairs and use them. Fix handrails that are broken or loose. Make sure that handrails are as long as the stairways. Check any carpeting to make sure that it is firmly attached to the stairs. Fix any carpet that is loose or worn. Avoid having throw rugs at the top or bottom of the stairs. If you do have throw rugs, attach them to the floor  with carpet tape. Make sure that you have a light switch at the top of the stairs and the bottom of the stairs. If you do not have them, ask someone to add them for you. What else can I do to help prevent falls? Wear shoes that: Do not have high heels. Have rubber bottoms. Are comfortable and fit you well. Are closed at the toe. Do not wear sandals. If you use a stepladder: Make sure that it is fully opened. Do not climb a closed stepladder. Make sure that both sides of the stepladder are locked into place. Ask someone to hold it for you, if possible. Clearly mark and make sure that you can see: Any grab bars or handrails. First and last steps. Where the edge of each step is. Use tools that help you move around (mobility aids)  if they are needed. These include: Canes. Walkers. Scooters. Crutches. Turn on the lights when you go into a dark area. Replace any light bulbs as soon as they burn out. Set up your furniture so you have a clear path. Avoid moving your furniture around. If any of your floors are uneven, fix them. If there are any pets around you, be aware of where they are. Review your medicines with your doctor. Some medicines can make you feel dizzy. This can increase your chance of falling. Ask your doctor what other things that you can do to help prevent falls. This information is not intended to replace advice given to you by your health care provider. Make sure you discuss any questions you have with your health care provider. Document Released: 01/25/2009 Document Revised: 09/06/2015 Document Reviewed: 05/05/2014 Elsevier Interactive Patient Education  2017 [employer].

## 2022-07-15 NOTE — Progress Notes (Signed)
Subjective:   Sharon Hood is a 89 y.o. female who presents for Medicare Annual (Subsequent) preventive examination.  Review of Systems     Cardiac Risk Factors include: advanced age (>57men, >81 women)     Objective:    Today's Vitals   07/15/22 1017  BP: 124/82  Pulse: 67  Temp: 97.9 F (36.6 C)  SpO2: 97%  Weight: 120 lb (54.4 kg)  Height: 5\' 3"  (1.6 m)   Body mass index is 21.26 kg/m.     07/15/2022   10:20 AM 01/07/2022   10:03 AM 03/21/2021    1:56 PM 02/05/2021   11:04 AM 10/08/2020    3:21 PM 09/07/2020    2:13 PM 07/10/2020   11:04 AM  Advanced Directives  Does Patient Have a Medical Advance Directive? Yes Yes Yes Yes Yes No Yes  Type of Paramedic of [place];Living will;Out of facility DNR (pink MOST or yellow form) [place];Living will [place];Living will;Out of facility DNR (pink MOST or yellow form) [place];Living will;Out of facility DNR (pink MOST or yellow form) [place];Living will;Out of facility DNR (pink MOST or yellow form)  [place];Living will  Does patient want to make changes to medical advance directive? No - Patient declined No - Patient declined No - Patient declined No - Patient declined No - Patient declined  No - Patient declined  Copy of [place] in Chart? No - copy requested Yes - validated most recent copy scanned in chart (See row information) Yes - validated most recent copy scanned in chart (See row information) No - copy requested No - copy requested  No - copy requested  Would patient like information on creating a medical advance directive?     No - Patient declined No - Patient declined   Pre-existing out of facility DNR order (yellow form or pink MOST form)   Yellow form placed in chart (order not valid for inpatient use)        Current Medications (verified) Outpatient Encounter  Medications as of 07/15/2022  Medication Sig   albuterol (VENTOLIN HFA) 108 (90 Base) MCG/ACT inhaler Inhale into the lungs every 6 (six) hours as needed for wheezing or shortness of breath.   calcium carbonate (OSCAL) 1500 (600 Ca) MG TABS tablet Take 1,500 mg daily with breakfast by mouth.    Fluticasone-Salmeterol (ADVAIR) 250-50 MCG/DOSE AEPB Inhale 1 puff into the lungs 2 (two) times daily as needed.   montelukast (SINGULAIR) 10 MG tablet Take 10 mg every morning by mouth.    [DISCONTINUED] ketoconazole (NIZORAL) 2 % shampoo APPLY 1 APPLICATION TOPICALLY 2 (TWO) TIMES A WEEK.   No facility-administered encounter medications on file as of 07/15/2022.    Allergies (verified) Sulfa antibiotics   History: Past Medical History:  Diagnosis Date   Arthritis    Asthma    Black-out (not amnesia) 2016   Cataract    OS   Vertigo    2 episodes.  Most recent approx 2019   Wears hearing aid in both ears    Past Surgical History:  Procedure Laterality Date   APPENDECTOMY     CATARACT EXTRACTION Right    CATARACT EXTRACTION W/PHACO Left 06/05/2020   Procedure: CATARACT EXTRACTION PHACO AND INTRAOCULAR LENS PLACEMENT (IOC) LEFT 8.23 00:44.3;  Surgeon: Birder Robson, MD;  Location: [facility];  Service: Ophthalmology;  Laterality: Left;   EYE SURGERY Right 05/24/2018  Cat Sx   TONSILLECTOMY     54 yr old   Family History  Problem Relation Age of Onset   Other Mother        40 yrs old   Cancer Father    Asthma Brother    Social History   Socioeconomic History   Marital status: Widowed    Spouse name: Not on file   Number of children: 2   Years of education: College   Highest education level: Not on file  Occupational History   Occupation: Retired  Tobacco Use   Smoking status: Former    Types: Cigarettes    Quit date: 01/22/1963    Years since quitting: 59.5   Smokeless tobacco: Never  Vaping Use   Vaping Use: Never used  Substance and Sexual Activity    Alcohol use: Yes    Comment: 1 drink 2-3 days a week.   Drug use: No   Sexual activity: Not on file  Other Topics Concern   Not on file  Social History Narrative   Lives at home alone   Right-handed   Caffeine: about 4 cups per day      Tobacco use, amount per day now: None.   Past tobacco use, amount per day: In my early 20's, maybe 3 cigarettes a day.   How many years did you use tobacco: Maybe 4   Alcohol use (drinks per week): 1 drink 2-3 days a week.   Diet: No.   Do you drink/eat things with caffeine: Coffee   Marital status:  Widow                                What year were you married? Twice [place] you live in a house, apartment, assisted living, condo, trailer, etc.? Condo   Is it one or more stories? One.   How many persons live in your home? Self   Do you have pets in your home?( please list) NO!   Highest Level of education completed? 4 years of college.    Current or past profession: Teacher   Do you exercise?  Yes.                               Type and how often? Walk 4-5 times a week.   Do you have a living will? Yes.   Do you have a DNR form?  Yes.                                 If not, do you want to discuss one?   Do you have signed POA/HPOA forms? Yes.                        If so, please bring to you appointment      Do you have any difficulty bathing or dressing yourself? No   Do you have any difficulty preparing food or eating? No   Do you have any difficulty managing your medications? No   Do you have any difficulty managing your finances? No   Do you have any difficulty affording your medications? No   Social Determinants of Radio broadcast assistant Strain: Not on file  Food Insecurity: Not on file  Transportation Needs:  Not on file  Physical Activity: Not on file  Stress: Not on file  Social Connections: Not on file    Tobacco Counseling Counseling given: Not Answered   Clinical Intake:  Pre-visit preparation completed:  Yes  Pain : No/denies pain     BMI - recorded: 21.26 Nutritional Status: BMI of 19-24  Normal Nutritional Risks: None Diabetes: No  How often do you need to have someone help you when you read instructions, pamphlets, or other written materials from your doctor or pharmacy?: 1 - Never  Diabetic?no  Interpreter Needed?: No      Activities of Daily Living    07/15/2022   10:40 AM  In your present state of health, do you have any difficulty performing the following activities:  Hearing? 1  Comment wears hearing aides  Vision? 0  Difficulty concentrating or making decisions? 0  Walking or climbing stairs? 0  Dressing or bathing? 0  Doing errands, shopping? 0  Preparing Food and eating ? N  Using the Toilet? N  In the past six months, have you accidently leaked urine? N  Do you have problems with loss of bowel control? N  Managing your Medications? N  Managing your Finances? N  Housekeeping or managing your Housekeeping? N    Patient Care Team: Lauree Chandler, NP as PCP - General (Geriatric Medicine)  Indicate any recent Medical Services you may have received from other than [facility] providers in the past year (date may be approximate).     Assessment:   This is a routine wellness examination for [place].  Hearing/Vision screen No results found.  Dietary issues and exercise activities discussed: Current Exercise Habits: Structured exercise class;Home exercise routine, Type of exercise: yoga;calisthenics, Time (Minutes): 45, Frequency (Times/Week): 3, Weekly Exercise (Minutes/Week): 135   Goals Addressed   None    Depression Screen    07/15/2022   10:22 AM 03/21/2021    1:52 PM  PHQ 2/9 Scores  PHQ - 2 Score 0 0    Fall Risk    07/15/2022   10:22 AM 01/07/2022    9:37 AM 07/02/2021   10:18 AM 03/21/2021    1:54 PM 02/05/2021   11:06 AM  [place] in the past year? 0 0 0 0 0  Number falls in past yr: 0 0 0 0 0  Injury with Fall? 0 0 0 0 0  Risk  for fall due to :  No Fall Risks No Fall Risks No Fall Risks No Fall Risks  Follow up  Falls evaluation completed Falls evaluation completed Falls evaluation completed Falls evaluation completed    [place]:  Any stairs in or around the home? Yes  If so, are there any without handrails? No  Home free of loose throw rugs in walkways, pet beds, electrical cords, etc? Yes  Adequate lighting in your home to reduce risk of falls? Yes   ASSISTIVE DEVICES UTILIZED TO PREVENT FALLS:  Life alert? No  Use of a cane, walker or w/c? No  Grab bars in the bathroom? Yes  Shower chair or bench in shower? No  Elevated toilet seat or a handicapped toilet? Yes   TIMED UP AND GO:  Was the test performed? No .    Cognitive Function:        07/15/2022   10:30 AM 03/21/2021    1:56 PM  6CIT Screen  What Year? 0 points 0 points  What month? 0  points 0 points  What time? 0 points 0 points  Count back from 20 0 points 0 points  Months in reverse 0 points 0 points  Repeat phrase 0 points 2 points  Total Score 0 points 2 points    Immunizations Immunization History  Administered Date(s) Administered   Influenza, High Dose Seasonal PF 01/22/2018, 01/28/2021   Influenza-Unspecified 03/14/2013, 12/14/2019, 01/28/2022   Moderna Sars-Covid-2 Vaccination 04/26/2019, 05/24/2019, 10/03/2020   Pneumococcal Conjugate-13 04/24/2015   Pneumococcal Polysaccharide-23 07/03/2021   Td 05/18/2018   Tdap 05/18/2018, 09/07/2020   Zoster Recombinat (Shingrix) 10/29/2020, 04/14/2021    TDAP status: Up to date  Flu Vaccine status: Up to date  Pneumococcal vaccine status: Up to date  Covid-19 vaccine status: Declined, Education has been provided regarding the importance of this vaccine but patient still declined. Advised may receive this vaccine at local pharmacy or Health Dept.or vaccine clinic. Aware to provide a copy of the vaccination record if obtained from local  pharmacy or Health Dept. Verbalized acceptance and understanding.  Qualifies for Shingles Vaccine? Yes   Zostavax completed No   Shingrix Completed?: No.    Education has been provided regarding the importance of this vaccine. Patient has been advised to call insurance company to determine out of pocket expense if they have not yet received this vaccine. Advised may also receive vaccine at local pharmacy or Health Dept. Verbalized acceptance and understanding.  Screening Tests Health Maintenance  Topic Date Due   COVID-19 Vaccine (4 - 2023-24 season) 08/18/2031 (Originally 12/13/2021)   INFLUENZA VACCINE  11/13/2022   Medicare Annual Wellness (AWV)  07/15/2023   DTaP/Tdap/Td (4 - Td or Tdap) 09/08/2030   Pneumonia Vaccine 47+ Years old  Completed   DEXA SCAN  Completed   Zoster Vaccines- Shingrix  Completed   HPV VACCINES  Aged Out    Health Maintenance  There are no preventive care reminders to display for this patient.   Colorectal cancer screening: No longer required.   Mammogram status: No longer required due to age.  Declines bone density  Lung Cancer Screening: (Low Dose CT Chest recommended if Age 55-80 years, 30 pack-year currently smoking OR have quit w/in 15years.) does not qualify.   Lung Cancer Screening Referral: na  Additional Screening:  Hepatitis C Screening: does not qualify  Vision Screening: Recommended annual ophthalmology exams for early detection of glaucoma and other disorders of the eye. Is the patient up to date with their annual eye exam?  Yes  Who is the provider or what is the name of the office in which the patient attends annual eye exams? [facility] If pt is not established with a provider, would they like to be referred to a provider to establish care? No .   Dental Screening: Recommended annual dental exams for proper oral hygiene  Community Resource Referral / Chronic Care Management: CRR required this visit?  No   CCM required this  visit?  No      Plan:     I have personally reviewed and noted the following in the patient's chart:   Medical and social history Use of alcohol, tobacco or illicit drugs  Current medications and supplements including opioid prescriptions. Patient is not currently taking opioid prescriptions. Functional ability and status Nutritional status Physical activity Advanced directives List of other physicians Hospitalizations, surgeries, and ER visits in previous 12 months Vitals Screenings to include cognitive, depression, and falls Referrals and appointments  In addition, I have reviewed and discussed with patient  certain preventive protocols, quality metrics, and best practice recommendations. A written personalized care plan for preventive services as well as general preventive health recommendations were provided to patient.     Lauree Chandler, NP   07/15/2022   Place of service:[facility]

## 2022-07-30 ENCOUNTER — Encounter: Payer: Self-pay | Admitting: Student

## 2022-07-30 ENCOUNTER — Ambulatory Visit: Payer: Medicare PPO | Admitting: Student

## 2022-07-30 VITALS — BP 124/82 | HR 62 | Temp 97.5°F | Ht 63.0 in | Wt 122.0 lb

## 2022-07-30 DIAGNOSIS — S81802A Unspecified open wound, left lower leg, initial encounter: Secondary | ICD-10-CM | POA: Diagnosis not present

## 2022-07-30 NOTE — Patient Instructions (Signed)
Clean the area with soap and water (during shower).   Rinse with saline after your shower  Dry area  Apply Vaseline and bandage daily.   If you have redness, drainage, or other signs of infection, please do not hesitate to make an appointment.

## 2022-07-30 NOTE — Progress Notes (Signed)
[facility] clinic [facility]   Provider: Dr. Earnestine Mealing  Code Status: DNR Goals of Care:     07/30/2022    2:55 PM  Advanced Directives  Does Patient Have a Medical Advance Directive? Yes  Type of Estate agent of [place];Out of facility DNR (pink MOST or yellow form);Living will  Does patient want to make changes to medical advance directive? No - Patient declined  Copy of Healthcare Power of Attorney in Chart? No - copy requested     Chief Complaint  Patient presents with   Acute Visit    Left Leg Wound. Attacked by Friends dog on Sunday.     HPI: Patient is a 89 y.o. female seen today for an acute visit for concern for wound on leg. She was visiting a friend on Sunday and the dog jumped on her. This was the second time in the last couple of weeks and she is concerned about how it is healing.   Past Medical History:  Diagnosis Date   Arthritis    Asthma    Black-out (not amnesia) 2016   Cataract    OS   Vertigo    2 episodes.  Most recent approx 2019   Wears hearing aid in both ears     Past Surgical History:  Procedure Laterality Date   APPENDECTOMY     CATARACT EXTRACTION Right    CATARACT EXTRACTION W/PHACO Left 06/05/2020   Procedure: CATARACT EXTRACTION PHACO AND INTRAOCULAR LENS PLACEMENT (IOC) LEFT 8.23 00:44.3;  Surgeon: Galen Manila, MD;  Location: [facility];  Service: Ophthalmology;  Laterality: Left;   EYE SURGERY Right 05/24/2018   Cat Sx   TONSILLECTOMY     8 yr old    Allergies  Allergen Reactions   Sulfa Antibiotics Nausea Only    Outpatient Encounter Medications as of 07/30/2022  Medication Sig   albuterol (VENTOLIN HFA) 108 (90 Base) MCG/ACT inhaler Inhale into the lungs every 6 (six) hours as needed for wheezing or shortness of breath.   calcium carbonate (OSCAL) 1500 (600 Ca) MG TABS tablet Take 1,500 mg daily with breakfast by mouth.    Fluticasone-Salmeterol (ADVAIR) 250-50 MCG/DOSE AEPB Inhale 1  puff into the lungs 2 (two) times daily as needed.   montelukast (SINGULAIR) 10 MG tablet Take 10 mg every morning by mouth.    No facility-administered encounter medications on file as of 07/30/2022.    Review of Systems:  Review of Systems  Health Maintenance  Topic Date Due   COVID-19 Vaccine (4 - 2023-24 season) 08/18/2031 (Originally 12/13/2021)   INFLUENZA VACCINE  11/13/2022   Medicare Annual Wellness (AWV)  07/15/2023   DTaP/Tdap/Td (4 - Td or Tdap) 09/08/2030   Pneumonia Vaccine 21+ Years old  Completed   DEXA SCAN  Completed   Zoster Vaccines- Shingrix  Completed   HPV VACCINES  Aged Out    Physical Exam: Vitals:   07/30/22 1455  BP: 124/82  Pulse: 62  Temp: (!) 97.5 F (36.4 C)  SpO2: 94%  Weight: 122 lb (55.3 kg)  Height:  (1.6 m)   Body mass index is 21.61 kg/m. Physical Exam Constitutional:      Appearance: Normal appearance.  Skin:    Comments: Left lower leg with 2 v-shaped wounds with overlying skin well-approximated. No purulent drainage or surrounding erythema. Some subcutaneous fat visible.   Neurological:     Mental Status: She is alert.     Labs reviewed: Basic Metabolic Panel: Recent  Labs    01/06/22 0000 02/10/22 0000  NA 133* 135*  K 4.4 4.3  CL 98* 98*  CO2 31* 25*  BUN 12 11  CREATININE 0.7 0.7  CALCIUM 9.6 9.2   Liver Function Tests: Recent Labs    01/06/22 0000  AST 33  ALT 18  ALKPHOS 55  ALBUMIN 4.3   No results for input(s): "LIPASE", "AMYLASE" in the last 8760 hours. No results for input(s): "AMMONIA" in the last 8760 hours. CBC: Recent Labs    01/06/22 0000  WBC 6.2  HGB 14.1  HCT 42  PLT 356   Lipid Panel: No results for input(s): "CHOL", "HDL", "LDLCALC", "TRIG", "CHOLHDL", "LDLDIRECT" in the last 8760 hours. No results found for: "HGBA1C"  Procedures since last visit: No results found.  Assessment/Plan Leg wound, left, initial encounter Wound cleaned with normal saline. Applied vaseline and  non-stick bandage. Discussed wound care. Discussed infection signs and symptoms. F/u PRN   Labs/tests ordered:  * No order type specified * Next appt:  01/13/2023

## 2022-08-22 ENCOUNTER — Other Ambulatory Visit: Payer: Self-pay

## 2022-08-22 MED ORDER — FLUTICASONE-SALMETEROL 250-50 MCG/ACT IN AEPB: 1.0000 | INHALATION_SPRAY | Freq: Two times a day (BID) | RESPIRATORY_TRACT | 4 refills | Status: DC

## 2022-08-22 NOTE — Telephone Encounter (Signed)
Patient called and states that insurance changed coverage on medication. It was previously Advair but has now changed to [employer]. Patient states she needs medication refilled and sent to pharmacy. Patient states she needs this done today because she's leaving out of town. Medication pend and sent to PCP Janyth Contes Janene Harvey, NP for approval.

## 2022-09-02 ENCOUNTER — Telehealth: Payer: Self-pay

## 2022-09-02 NOTE — Telephone Encounter (Signed)
Prescription refilled by PCP Sharon Seller, NP

## 2022-09-02 NOTE — Telephone Encounter (Signed)
Make appt to discuss

## 2022-09-02 NOTE — Telephone Encounter (Signed)
Called patient and I left message for her to call the office when available.

## 2022-09-02 NOTE — Telephone Encounter (Signed)
Patient called requesting refill on clobetasol. Medication is not on active or discontinued medication list.  Message sent to Abbey Chatters, NP

## 2022-12-29 ENCOUNTER — Other Ambulatory Visit: Payer: Self-pay | Admitting: Nurse Practitioner

## 2023-01-13 ENCOUNTER — Ambulatory Visit: Payer: Medicare PPO | Admitting: Nurse Practitioner

## 2023-01-14 DIAGNOSIS — Z85820 Personal history of malignant melanoma of skin: Secondary | ICD-10-CM | POA: Diagnosis not present

## 2023-01-14 DIAGNOSIS — D2262 Melanocytic nevi of left upper limb, including shoulder: Secondary | ICD-10-CM | POA: Diagnosis not present

## 2023-01-14 DIAGNOSIS — D485 Neoplasm of uncertain behavior of skin: Secondary | ICD-10-CM | POA: Diagnosis not present

## 2023-01-14 DIAGNOSIS — D225 Melanocytic nevi of trunk: Secondary | ICD-10-CM | POA: Diagnosis not present

## 2023-01-14 DIAGNOSIS — L570 Actinic keratosis: Secondary | ICD-10-CM | POA: Diagnosis not present

## 2023-01-14 DIAGNOSIS — D2272 Melanocytic nevi of left lower limb, including hip: Secondary | ICD-10-CM | POA: Diagnosis not present

## 2023-01-14 DIAGNOSIS — Z85828 Personal history of other malignant neoplasm of skin: Secondary | ICD-10-CM | POA: Diagnosis not present

## 2023-01-14 DIAGNOSIS — D2261 Melanocytic nevi of right upper limb, including shoulder: Secondary | ICD-10-CM | POA: Diagnosis not present

## 2023-01-20 ENCOUNTER — Encounter: Payer: Self-pay | Admitting: Nurse Practitioner

## 2023-01-20 ENCOUNTER — Ambulatory Visit: Payer: Medicare PPO | Admitting: Nurse Practitioner

## 2023-01-20 VITALS — BP 122/74 | HR 88 | Temp 97.8°F | Ht 63.0 in | Wt 121.0 lb

## 2023-01-20 DIAGNOSIS — M810 Age-related osteoporosis without current pathological fracture: Secondary | ICD-10-CM | POA: Diagnosis not present

## 2023-01-20 DIAGNOSIS — H9113 Presbycusis, bilateral: Secondary | ICD-10-CM

## 2023-01-20 DIAGNOSIS — M150 Primary generalized (osteo)arthritis: Secondary | ICD-10-CM | POA: Diagnosis not present

## 2023-01-20 DIAGNOSIS — J4520 Mild intermittent asthma, uncomplicated: Secondary | ICD-10-CM | POA: Diagnosis not present

## 2023-01-20 NOTE — Progress Notes (Signed)
Careteam: Patient Care Team: Sharon Seller, NP as PCP - General (Geriatric Medicine)  Advanced Directive information Does Patient Have a Medical Advance Directive?: Yes, Type of Advance Directive: Healthcare Power of [place];Living will, Does patient want to make changes to medical advance directive?: No - Patient declined  Allergies  Allergen Reactions   Sulfa Antibiotics Nausea Only    Chief Complaint  Patient presents with   Medical Management of Chronic Issues    Yearly follow-up. Discuss need for flu vaccine and covid booster.      HPI: Patient is a 89 y.o. female seen in today at the [facility] for routine follow up   She has been to the dermatologist this week.  HOH wearing hearing aides  No issues with asthma, unsure when she has used rescue  inhaler last  Using wixela twice daily with good results.   She has not gotten covid booster, may not get   Going to exercise 3 times a week- taking cal and vit d  Does not want to update bone density.   She would prefer not to do blood work   Review of Systems:  Review of Systems  Constitutional:  Negative for chills, fever and weight loss.  HENT:  Negative for tinnitus.   Respiratory:  Negative for cough, sputum production and shortness of breath.   Cardiovascular:  Negative for chest pain, palpitations and leg swelling.  Gastrointestinal:  Negative for abdominal pain, constipation, diarrhea and heartburn.  Genitourinary:  Negative for dysuria, frequency and urgency.  Musculoskeletal:  Negative for back pain, falls, joint pain and myalgias.  Skin: Negative.   Neurological:  Negative for dizziness and headaches.  Psychiatric/Behavioral:  Negative for depression and memory loss. The patient does not have insomnia.    Past Medical History:  Diagnosis Date   Arthritis    Asthma    Black-out (not amnesia) 2016   Cataract    OS   Vertigo    2 episodes.  Most recent approx 2019   Wears hearing aid in  both ears    Past Surgical History:  Procedure Laterality Date   APPENDECTOMY     CATARACT EXTRACTION Right    CATARACT EXTRACTION W/PHACO Left 06/05/2020   Procedure: CATARACT EXTRACTION PHACO AND INTRAOCULAR LENS PLACEMENT (IOC) LEFT 8.23 00:44.3;  Surgeon: Galen Manila, MD;  Location: [facility];  Service: Ophthalmology;  Laterality: Left;   EYE SURGERY Right 05/24/2018   Cat Sx   TONSILLECTOMY     8 yr old   Social History:   reports that she quit smoking about 60 years ago. Her smoking use included cigarettes. She has never used smokeless tobacco. She reports current alcohol use. She reports that she does not use drugs.  Family History  Problem Relation Age of Onset   Other Mother        67 yrs old   Cancer Father    Asthma Brother     Medications: Patient's Medications  New Prescriptions   No medications on file  Previous Medications   ALBUTEROL (VENTOLIN HFA) 108 (90 BASE) MCG/ACT INHALER    Inhale into the lungs every 6 (six) hours as needed for wheezing or shortness of breath.   CALCIUM CARBONATE (OSCAL) 1500 (600 CA) MG TABS TABLET    Take 1,500 mg daily with breakfast by mouth.    MONTELUKAST (SINGULAIR) 10 MG TABLET    Take 10 mg every morning by mouth.    WIXELA INHUB 250-50 MCG/ACT  AEPB    INHALE 1 PUFF INTO THE LUNGS IN THE MORNING AND AT BEDTIME.  Modified Medications   No medications on file  Discontinued Medications   No medications on file    Physical Exam:  Vitals:   01/20/23 0819  BP: 122/74  Pulse: 88  Temp: 97.8 F (36.6 C)  TempSrc: Temporal  SpO2: 96%  Weight: 121 lb (54.9 kg)  Height: 5\' 3"  (1.6 m)   Body mass index is 21.43 kg/m. Wt Readings from Last 3 Encounters:  01/20/23 121 lb (54.9 kg)  07/30/22 122 lb (55.3 kg)  07/15/22 120 lb (54.4 kg)    Physical Exam Constitutional:      General: She is not in acute distress.    Appearance: She is well-developed. She is not diaphoretic.  HENT:     Head: Normocephalic  and atraumatic.     Mouth/Throat:     Pharynx: No oropharyngeal exudate.  Eyes:     Conjunctiva/sclera: Conjunctivae normal.     Pupils: Pupils are equal, round, and reactive to light.  Cardiovascular:     Rate and Rhythm: Normal rate and regular rhythm.     Heart sounds: Normal heart sounds.  Pulmonary:     Effort: Pulmonary effort is normal.     Breath sounds: Normal breath sounds.  Abdominal:     General: Bowel sounds are normal.     Palpations: Abdomen is soft.  Musculoskeletal:     Cervical back: Normal range of motion and neck supple.     Right lower leg: No edema.     Left lower leg: No edema.  Skin:    General: Skin is warm and dry.  Neurological:     Mental Status: She is alert.  Psychiatric:        Mood and Affect: Mood normal.     Labs reviewed: Basic Metabolic Panel: Recent Labs    02/10/22 0000  NA 135*  K 4.3  CL 98*  CO2 25*  BUN 11  CREATININE 0.7  CALCIUM 9.2   Liver Function Tests: No results for input(s): "AST", "ALT", "ALKPHOS", "BILITOT", "PROT", "ALBUMIN" in the last 8760 hours. No results for input(s): "LIPASE", "AMYLASE" in the last 8760 hours. No results for input(s): "AMMONIA" in the last 8760 hours. CBC: No results for input(s): "WBC", "NEUTROABS", "HGB", "HCT", "MCV", "PLT" in the last 8760 hours. Lipid Panel: No results for input(s): "CHOL", "HDL", "LDLCALC", "TRIG", "CHOLHDL", "LDLDIRECT" in the last 8760 hours. TSH: No results for input(s): "TSH" in the last 8760 hours. A1C: No results found for: "HGBA1C"   Assessment/Plan 1. Primary osteoarthritis involving multiple joints Stable without significant pain at this time  2. Age-related osteoporosis without current pathological fracture Does not wish to follow up bone density or intensify treatment.  -continues on cal and vit d with weight bearing exercise   3. Mild intermittent asthma without complication -very well controlled at thi stime on wixela twice daily and  singulair.   4. Presbycusis of both ears -continues to wear hearing aides  Next appt: yearly  Nariya Neumeyer K. Biagio Borg  [facility] [phone]

## 2023-03-24 ENCOUNTER — Encounter: Payer: Self-pay | Admitting: Nurse Practitioner

## 2023-03-24 ENCOUNTER — Ambulatory Visit: Payer: Medicare PPO | Admitting: Nurse Practitioner

## 2023-03-24 VITALS — BP 130/76 | HR 64 | Temp 97.8°F | Ht 63.0 in | Wt 119.0 lb

## 2023-03-24 DIAGNOSIS — R1031 Right lower quadrant pain: Secondary | ICD-10-CM

## 2023-03-24 NOTE — Progress Notes (Signed)
Careteam: Patient Care Team: Sharon Seller, NP as PCP - General (Geriatric Medicine)  Advanced Directive information Does Patient Have a Medical Advance Directive?: Yes, Type of Advance Directive: Healthcare Power of [place];Living will, Does patient want to make changes to medical advance directive?: No - Patient declined  Allergies  Allergen Reactions   Sulfa Antibiotics Nausea Only    Chief Complaint  Patient presents with   Acute Visit    Knot on Right Side that comes and goes. When she feels the knot, it is painful. Last time she felt it was Sunday.      HPI: Patient is a 89 y.o. female seen in today at the [facility] for knot on right lower abdomen.  Reports this comes and goes.  Last a few seconds to 15 mins (which happened 2 days ago) and then will go away. Will feel discomfort when it comes up but then will just go away.  Generally last about 5 mins.   No issues the last 2 days.  May no happen at all or may have 3 episodes a week. No nausea, vomiting, diarrhea or constipation.  She has an incision to her lower abdomen from appendectomy but never had issues with this before.   Reports this has been on and off for several months, now more frequent  Review of Systems:  Review of Systems  Constitutional:  Negative for chills, fever and weight loss.  HENT:  Negative for tinnitus.   Respiratory:  Negative for cough, sputum production and shortness of breath.   Cardiovascular:  Negative for chest pain, palpitations and leg swelling.  Gastrointestinal:  Negative for abdominal pain, constipation, diarrhea and heartburn.  Genitourinary:  Negative for dysuria, frequency and urgency.  Musculoskeletal:  Negative for back pain, falls, joint pain and myalgias.  Skin: Negative.   Neurological:  Negative for dizziness and headaches.  Psychiatric/Behavioral:  Negative for depression and memory loss. The patient does not have insomnia.     Past Medical History:   Diagnosis Date   Arthritis    Asthma    Black-out (not amnesia) 2016   Cataract    OS   Vertigo    2 episodes.  Most recent approx 2019   Wears hearing aid in both ears    Past Surgical History:  Procedure Laterality Date   APPENDECTOMY     CATARACT EXTRACTION Right    CATARACT EXTRACTION W/PHACO Left 06/05/2020   Procedure: CATARACT EXTRACTION PHACO AND INTRAOCULAR LENS PLACEMENT (IOC) LEFT 8.23 00:44.3;  Surgeon: Porfilio, William, MD;  Location: [facility];  Service: Ophthalmology;  Laterality: Left;   EYE SURGERY Right 05/24/2018   Cat Sx   TONSILLECTOMY     8 yr old   Social History:   reports that she quit smoking about 60 years ago. Her smoking use included cigarettes. She has never used smokeless tobacco. She reports current alcohol use. She reports that she does not use drugs.  Family History  Problem Relation Age of Onset   Other Mother        10 1 yrs old   Cancer Father    Asthma Brother     Medications: Patient's Medications  New Prescriptions   No medications on file  Previous Medications   ALBUTEROL (VENTOLIN HFA) 108 (90 BASE) MCG/ACT INHALER    Inhale into the lungs every 6 (six) hours as needed for wheezing or shortness of breath.   CALCIUM CARBONATE (OSCAL) 1500 (600 CA) MG TABS TABLET  Take 1,500 mg daily with breakfast by mouth.    MONTELUKAST (SINGULAIR) 10 MG TABLET    Take 10 mg every morning by mouth.    WIXELA INHUB 250-50 MCG/ACT AEPB    INHALE 1 PUFF INTO THE LUNGS IN THE MORNING AND AT BEDTIME.  Modified Medications   No medications on file  Discontinued Medications   No medications on file    Physical Exam:  Vitals:   03/24/23 0849  BP: 130/76  Pulse: 64  Temp: 97.8 F (36.6 C)  SpO2: 96%  Weight: 119 lb (54 kg)  Height: 5\' 3"  (1.6 m)   Body mass index is 21.08 kg/m. Wt Readings from Last 3 Encounters:  03/24/23 119 lb (54 kg)  01/20/23 121 lb (54.9 kg)  07/30/22 122 lb (55.3 kg)    Physical  Exam Constitutional:      General: She is not in acute distress.    Appearance: She is well-developed. She is not diaphoretic.  HENT:     Head: Normocephalic and atraumatic.     Mouth/Throat:     Pharynx: No oropharyngeal exudate.  Eyes:     Conjunctiva/sclera: Conjunctivae normal.     Pupils: Pupils are equal, round, and reactive to light.  Cardiovascular:     Rate and Rhythm: Normal rate and regular rhythm.     Heart sounds: Normal heart sounds.  Pulmonary:     Effort: Pulmonary effort is normal.     Breath sounds: Normal breath sounds.  Abdominal:     General: Bowel sounds are normal.     Palpations: Abdomen is soft.  Musculoskeletal:     Cervical back: Normal range of motion and neck supple.     Right lower leg: No edema.     Left lower leg: No edema.  Skin:    General: Skin is warm and dry.  Neurological:     Mental Status: She is alert.  Psychiatric:        Mood and Affect: Mood normal.    Labs reviewed: Basic Metabolic Panel: No results for input(s): "NA", "K", "CL", "CO2", "GLUCOSE", "BUN", "CREATININE", "CALCIUM", "MG", "PHOS", "TSH" in the last 8760 hours. Liver Function Tests: No results for input(s): "AST", "ALT", "ALKPHOS", "BILITOT", "PROT", "ALBUMIN" in the last 8760 hours. No results for input(s): "LIPASE", "AMYLASE" in the last 8760 hours. No results for input(s): "AMMONIA" in the last 8760 hours. CBC: No results for input(s): "WBC", "NEUTROABS", "HGB", "HCT", "MCV", "PLT" in the last 8760 hours. Lipid Panel: No results for input(s): "CHOL", "HDL", "LDLCALC", "TRIG", "CHOLHDL", "LDLDIRECT" in the last 8760 hours. TSH: No results for input(s): "TSH" in the last 8760 hours. A1C: No results found for: "HGBA1C"   Assessment/Plan 1. Right lower quadrant abdominal pain Exam normal today with no pain or knots noted. Bowels moving well. No n/v/d/constipation or fever.  Discussed CT for further evaluation but she would like to defer at this time. Will get  blood work and if symptoms continue to increase in frequency will get abdominal CT to evaluate - CBC with Differential/Platelet - Complete Metabolic Panel with eGFR  Sharon Hood K. Biagio Borg  [facility] [phone]

## 2023-03-24 NOTE — Progress Notes (Signed)
bv

## 2023-03-25 DIAGNOSIS — S40011A Contusion of right shoulder, initial encounter: Secondary | ICD-10-CM | POA: Diagnosis not present

## 2023-03-25 DIAGNOSIS — M25511 Pain in right shoulder: Secondary | ICD-10-CM | POA: Diagnosis not present

## 2023-03-26 DIAGNOSIS — R1031 Right lower quadrant pain: Secondary | ICD-10-CM | POA: Diagnosis not present

## 2023-03-26 LAB — COMPLETE METABOLIC PANEL WITH GFR
AG Ratio: 1.7 (calc) (ref 1.0–2.5)
ALT: 18 U/L (ref 6–29)
AST: 37 U/L — ABNORMAL HIGH (ref 10–35)
Albumin: 4.3 g/dL (ref 3.6–5.1)
Alkaline phosphatase (APISO): 75 U/L (ref 37–153)
BUN: 10 mg/dL (ref 7–25)
CO2: 30 mmol/L (ref 20–32)
Calcium: 9.4 mg/dL (ref 8.6–10.4)
Chloride: 96 mmol/L — ABNORMAL LOW (ref 98–110)
Creat: 0.63 mg/dL (ref 0.60–0.95)
Globulin: 2.6 g/dL (ref 1.9–3.7)
Glucose, Bld: 92 mg/dL (ref 65–99)
Potassium: 4.8 mmol/L (ref 3.5–5.3)
Sodium: 133 mmol/L — ABNORMAL LOW (ref 135–146)
Total Bilirubin: 1.1 mg/dL (ref 0.2–1.2)
Total Protein: 6.9 g/dL (ref 6.1–8.1)
eGFR: 83 mL/min/{1.73_m2} (ref >=60)

## 2023-03-26 LAB — CBC WITH DIFFERENTIAL/PLATELET
Absolute Lymphocytes: 2359 {cells}/uL (ref 850–3900)
Absolute Monocytes: 988 {cells}/uL — ABNORMAL HIGH (ref 200–950)
Basophils Absolute: 71 {cells}/uL (ref 0–200)
Basophils Relative: 0.8 %
Eosinophils Absolute: 214 {cells}/uL (ref 15–500)
Eosinophils Relative: 2.4 %
HCT: 43 % (ref 35.0–45.0)
Hemoglobin: 14.3 g/dL (ref 11.7–15.5)
MCH: 30.1 pg (ref 27.0–33.0)
MCHC: 33.3 g/dL (ref 32.0–36.0)
MCV: 90.5 fL (ref 80.0–100.0)
MPV: 8.9 fL (ref 7.5–12.5)
Monocytes Relative: 11.1 %
Neutro Abs: 5269 {cells}/uL (ref 1500–7800)
Neutrophils Relative %: 59.2 %
Platelets: 445 10*3/uL — ABNORMAL HIGH (ref 140–400)
RBC: 4.75 10*6/uL (ref 3.80–5.10)
RDW: 11.7 % (ref 11.0–15.0)
Total Lymphocyte: 26.5 %
WBC: 8.9 10*3/uL (ref 3.8–10.8)

## 2023-04-06 ENCOUNTER — Other Ambulatory Visit: Payer: Self-pay | Admitting: *Deleted

## 2023-04-06 MED ORDER — MONTELUKAST SODIUM 10 MG PO TABS: 10.0000 mg | ORAL_TABLET | ORAL | 5 refills | Status: DC

## 2023-04-06 NOTE — Telephone Encounter (Signed)
[facility] [place] called requesting a refill for patient's Singulair.

## 2023-05-05 ENCOUNTER — Encounter: Payer: Self-pay | Admitting: Nurse Practitioner

## 2023-05-05 ENCOUNTER — Ambulatory Visit: Payer: Medicare PPO | Admitting: Nurse Practitioner

## 2023-05-05 VITALS — BP 134/82 | HR 67 | Temp 97.2°F | Ht 63.0 in | Wt 117.0 lb

## 2023-05-05 DIAGNOSIS — J4521 Mild intermittent asthma with (acute) exacerbation: Secondary | ICD-10-CM

## 2023-05-05 MED ORDER — DOXYCYCLINE HYCLATE 100 MG PO TABS: 100.0000 mg | ORAL_TABLET | Freq: Two times a day (BID) | ORAL | 0 refills | Status: AC

## 2023-05-05 NOTE — Patient Instructions (Addendum)
To get mucinex DM by mouth twice daily for 1 week- take with full glass of water To start doxycycline 100 mg by mouth twice daily- take with food  Continue to use albuterol as needed for excessive cough, wheezing or shortness of breath   Make sure you are getting 3 meals a day and increase your fluids.

## 2023-05-05 NOTE — Progress Notes (Signed)
Careteam: Patient Care Team: Sharon Seller, NP as PCP - General (Geriatric Medicine)  Advanced Directive information Does Patient Have a Medical Advance Directive?: Yes, Type of Advance Directive: Healthcare Power of [place];Living will, Does patient want to make changes to medical advance directive?: No - Patient declined  Allergies  Allergen Reactions   Sulfa Antibiotics Nausea Only    Chief Complaint  Patient presents with   Acute Visit    Complains of Fatigue, Congestion and Cough Since Thursday. Covid Test done in office:      HPI: Patient is a 89 y.o. female seen in today at the [facility] for cough and congestion for 5 days She is improving. Could not talk 2 days ago  Reports she is staying home and resting Not eating like she is supposed to because decrease in appetite  Mucous is white.  Has a cough but much better.  [facility]  Had sore throat but better now.  Chest congestion No shortness of breath Felt her chest tightening and use her albuterol which helped She is using occasionally over the last few days and helps significantly   No headache, dizziness No nausea, vomiting or diarrhea.   Nursing heard abnormal lung sounds yesterday and was concerned Review of Systems:  Review of Systems  Constitutional:  Positive for malaise/fatigue. Negative for chills and fever.  HENT:  Negative for congestion, ear pain, hearing loss, sore throat and tinnitus.   Respiratory:  Positive for cough, sputum production and wheezing.   Cardiovascular:  Negative for chest pain and palpitations.    Past Medical History:  Diagnosis Date   Arthritis    Asthma    Black-out (not amnesia) 2016   Cataract    OS   Vertigo    2 episodes.  Most recent approx 2019   Wears hearing aid in both ears    Past Surgical History:  Procedure Laterality Date   APPENDECTOMY     CATARACT EXTRACTION Right    CATARACT  EXTRACTION W/PHACO Left 06/05/2020   Procedure: CATARACT EXTRACTION PHACO AND INTRAOCULAR LENS PLACEMENT (IOC) LEFT 8.23 00:44.3;  Surgeon: Galen Manila, MD;  Location: [facility];  Service: Ophthalmology;  Laterality: Left;   EYE SURGERY Right 05/24/2018   Cat Sx   TONSILLECTOMY     8 yr old   Social History:   reports that she quit smoking about 60 years ago. Her smoking use included cigarettes. She has never used smokeless tobacco. She reports current alcohol use. She reports that she does not use drugs.  Family History  Problem Relation Age of Onset   Other Mother        14 yrs old   Cancer Father    Asthma Brother     Medications: Patient's Medications  New Prescriptions   No medications on file  Previous Medications   ALBUTEROL (VENTOLIN HFA) 108 (90 BASE) MCG/ACT INHALER    Inhale into the lungs every 6 (six) hours as needed for wheezing or shortness of breath.   CALCIUM CARBONATE (OSCAL) 1500 (600 CA) MG TABS TABLET    Take 1,500 mg daily with breakfast by mouth.    MONTELUKAST (SINGULAIR) 10 MG TABLET    Take 1 tablet (10 mg total) by mouth every morning.   WIXELA INHUB 250-50 MCG/ACT AEPB    INHALE 1 PUFF INTO THE LUNGS IN THE MORNING AND AT BEDTIME.  Modified Medications  No medications on file  Discontinued Medications   No medications on file    Physical Exam:  Vitals:   05/05/23 0905  BP: 134/82  Pulse: 67  Temp: (!) 97.2 F (36.2 C)  SpO2: 99%  Weight: 117 lb (53.1 kg)  Height: 5\' 3"  (1.6 m)   Body mass index is 20.73 kg/m. Wt Readings from Last 3 Encounters:  05/05/23 117 lb (53.1 kg)  03/24/23 119 lb (54 kg)  01/20/23 121 lb (54.9 kg)    Physical Exam Constitutional:      General: She is not in acute distress.    Appearance: She is well-developed. She is not diaphoretic.  HENT:     Head: Normocephalic and atraumatic.     Mouth/Throat:     Pharynx: No oropharyngeal exudate.  Eyes:     Conjunctiva/sclera: Conjunctivae normal.      Pupils: Pupils are equal, round, and reactive to light.  Cardiovascular:     Rate and Rhythm: Normal rate and regular rhythm.     Heart sounds: Normal heart sounds.  Pulmonary:     Effort: Pulmonary effort is normal.     Breath sounds: Rhonchi (right lower lobe) present.  Abdominal:     General: Bowel sounds are normal.     Palpations: Abdomen is soft.  Musculoskeletal:     Cervical back: Normal range of motion and neck supple.     Right lower leg: No edema.     Left lower leg: No edema.  Skin:    General: Skin is warm and dry.  Neurological:     Mental Status: She is alert.  Psychiatric:        Mood and Affect: Mood normal.     Labs reviewed: Basic Metabolic Panel: Recent Labs    03/26/23 0820  NA 133*  K 4.8  CL 96*  CO2 30  GLUCOSE 92  BUN 10  CREATININE 0.63  CALCIUM 9.4   Liver Function Tests: Recent Labs    03/26/23 0820  AST 37*  ALT 18  BILITOT 1.1  PROT 6.9   No results for input(s): "LIPASE", "AMYLASE" in the last 8760 hours. No results for input(s): "AMMONIA" in the last 8760 hours. CBC: Recent Labs    03/26/23 0820  WBC 8.9  NEUTROABS 5,269  HGB 14.3  HCT 43.0  MCV 90.5  PLT 445*   Lipid Panel: No results for input(s): "CHOL", "HDL", "LDLCALC", "TRIG", "CHOLHDL", "LDLDIRECT" in the last 8760 hours. TSH: No results for input(s): "TSH" in the last 8760 hours. A1C: No results found for: "HGBA1C"   Assessment/Plan 1. Mild intermittent asthma with exacerbation (Primary) To get mucinex DM by mouth twice daily for 1 week- take with full glass of water To start doxycycline 100 mg by mouth twice daily- take with food Continue to use albuterol as needed for excessive cough, wheezing or shortness of breath  Make sure you are getting 3 meals a day and increase your fluids.  - doxycycline (VIBRA-TABS) 100 MG tablet; Take 1 tablet (100 mg total) by mouth 2 (two) times daily.  Dispense: 14 tablet; Refill: 0 To call if symptoms worsen or fail  to improve.   Janene Harvey. Biagio Borg  [facility] Adult [facility] [phone]

## 2023-05-08 ENCOUNTER — Telehealth: Payer: Self-pay

## 2023-05-08 NOTE — Telephone Encounter (Signed)
Yes, depending on how long it was gone could be something else.  If she needs appt we would be happy to see her next week

## 2023-05-08 NOTE — Telephone Encounter (Signed)
Patient is calling to ask is it possible for sore throat to go away and then suddenly return

## 2023-05-27 DIAGNOSIS — M1711 Unilateral primary osteoarthritis, right knee: Secondary | ICD-10-CM | POA: Diagnosis not present

## 2023-05-27 DIAGNOSIS — S63632A Sprain of interphalangeal joint of right middle finger, initial encounter: Secondary | ICD-10-CM | POA: Diagnosis not present

## 2023-05-27 DIAGNOSIS — S43204A Unspecified dislocation of right sternoclavicular joint, initial encounter: Secondary | ICD-10-CM | POA: Diagnosis not present

## 2023-06-11 DIAGNOSIS — L720 Epidermal cyst: Secondary | ICD-10-CM | POA: Diagnosis not present

## 2023-06-11 DIAGNOSIS — H353211 Exudative age-related macular degeneration, right eye, with active choroidal neovascularization: Secondary | ICD-10-CM | POA: Diagnosis not present

## 2023-06-11 DIAGNOSIS — H02831 Dermatochalasis of right upper eyelid: Secondary | ICD-10-CM | POA: Diagnosis not present

## 2023-06-11 DIAGNOSIS — H353230 Exudative age-related macular degeneration, bilateral, stage unspecified: Secondary | ICD-10-CM | POA: Diagnosis not present

## 2023-07-01 ENCOUNTER — Other Ambulatory Visit: Payer: Self-pay | Admitting: Nurse Practitioner

## 2023-07-21 ENCOUNTER — Encounter: Payer: Self-pay | Admitting: Nurse Practitioner

## 2023-07-21 ENCOUNTER — Ambulatory Visit (SKILLED_NURSING_FACILITY): Payer: Medicare PPO | Admitting: Nurse Practitioner

## 2023-07-21 VITALS — BP 138/76 | HR 64 | Temp 97.7°F | Ht 63.0 in | Wt 117.0 lb

## 2023-07-21 DIAGNOSIS — Z0000 Encounter for general adult medical examination without abnormal findings: Secondary | ICD-10-CM | POA: Diagnosis not present

## 2023-07-21 NOTE — Progress Notes (Signed)
Subjective:   Sharon Hood is a 89 y.o. female who presents for Medicare Annual (Subsequent) preventive examination.  Visit Complete: [facility]   Cardiac Risk Factors include: advanced age (>41men, >80 women)     Objective:    Today's Vitals   07/21/23 1033  BP: 138/76  Pulse: 64  Temp: 97.7 F (36.5 C)  SpO2: 97%  Weight: 117 lb (53.1 kg)  Height: 5\' 3"  (1.6 m)   Body mass index is 20.73 kg/m.     07/21/2023   10:37 AM 05/05/2023    9:08 AM 03/24/2023    8:51 AM 01/20/2023    9:27 AM 07/30/2022    2:55 PM 07/15/2022   10:20 AM 01/07/2022   10:03 AM  Advanced Directives  Does Patient Have a Medical Advance Directive? Yes Yes Yes Yes Yes Yes Yes  Type of Estate agent of [place];Out of facility DNR (pink MOST or yellow form) Healthcare Power of [place];Living will Healthcare Power of [place];Living will Healthcare Power of [place];Living will Healthcare Power of [place];Out of facility DNR (pink MOST or yellow form);Living will Healthcare Power of [place];Living will;Out of facility DNR (pink MOST or yellow form) Healthcare Power of [place];Living will  Does patient want to make changes to medical advance directive? No - Patient declined No - Patient declined No - Patient declined No - Patient declined No - Patient declined No - Patient declined No - Patient declined  Copy of Healthcare Power of Attorney in Chart? Yes - validated most recent copy scanned in chart (See row information) Yes - validated most recent copy scanned in chart (See row information) Yes - validated most recent copy scanned in chart (See row information) Yes - validated most recent copy scanned in chart (See row information) No - copy requested No - copy requested Yes - validated most recent copy scanned in chart (See row information)    Current Medications (verified) Outpatient Encounter Medications as of 07/21/2023  Medication Sig   albuterol (VENTOLIN HFA) 108  (90 Base) MCG/ACT inhaler Inhale into the lungs every 6 (six) hours as needed for wheezing or shortness of breath.   calcium carbonate (OSCAL) 1500 (600 Ca) MG TABS tablet Take 1,500 mg daily with breakfast by mouth.    doxycycline (VIBRA-TABS) 100 MG tablet Take 1 tablet (100 mg total) by mouth 2 (two) times daily.   montelukast (SINGULAIR) 10 MG tablet Take 1 tablet (10 mg total) by mouth every morning.   WIXELA INHUB 250-50 MCG/ACT AEPB INHALE 1 PUFF INTO THE LUNGS IN THE MORNING AND AT BEDTIME.   [DISCONTINUED] Fluticasone-Salmeterol (ADVAIR) 250-50 MCG/DOSE AEPB Inhale 1 puff into the lungs 2 (two) times daily as needed.   [DISCONTINUED] montelukast (SINGULAIR) 10 MG tablet Take 10 mg every morning by mouth.    No facility-administered encounter medications on file as of 07/21/2023.    Allergies (verified) Sulfa antibiotics   History: Past Medical History:  Diagnosis Date   Arthritis    Asthma    Black-out (not amnesia) 2016   Cataract    OS   Vertigo    2 episodes.  Most recent approx 2019   Wears hearing aid in both ears    Past Surgical History:  Procedure Laterality Date   APPENDECTOMY     CATARACT EXTRACTION Right    CATARACT EXTRACTION W/PHACO Left 06/05/2020   Procedure: CATARACT EXTRACTION PHACO AND INTRAOCULAR LENS PLACEMENT (IOC) LEFT 8.23 00:44.3;  Surgeon: Galen Manila, MD;  [facility];  Service: Ophthalmology;  Laterality: Left;   EYE SURGERY Right 05/24/2018   Cat Sx   TONSILLECTOMY     8 yr old   Family History  Problem Relation Age of Onset   Other Mother        28 yrs old   Cancer Father    Asthma Brother    Social History   Socioeconomic History   Marital status: Widowed    Spouse name: Not on file   Number of children: 2   Years of education: College   Highest education level: Not on file  Occupational History   Occupation: Retired  Tobacco Use   Smoking status: Former    Current packs/day: 0.00    Types:  Cigarettes    Quit date: 01/22/1963    Years since quitting: 60.5   Smokeless tobacco: Never  Vaping Use   Vaping status: Never Used  Substance and Sexual Activity   Alcohol use: Yes    Comment: 1 drink 2-3 days a week.   Drug use: No   Sexual activity: Not on file  Other Topics Concern   Not on file  Social History Narrative   Lives at home alone   Right-handed   Caffeine: about 4 cups per day      Tobacco use, amount per day now: None.   Past tobacco use, amount per day: In my early 20's, maybe 3 cigarettes a day.   How many years did you use tobacco: Maybe 4   Alcohol use (drinks per week): 1 drink 2-3 days a week.   Diet: No.   Do you drink/eat things with caffeine: Coffee   Marital status:  Widow                                What year were you married? Twice 1957, 1999   Do you live in a house, apartment, assisted living, condo, trailer, etc.? Condo   Is it one or more stories? One.   How many persons live in your home? Self   Do you have pets in your home?( please list) NO!   Highest Level of education completed? 4 years of college.    Current or past profession: Teacher   Do you exercise?  Yes.                               Type and how often? Walk 4-5 times a week.   Do you have a living will? Yes.   Do you have a DNR form?  Yes.                                 If not, do you want to discuss one?   Do you have signed POA/HPOA forms? Yes.                        If so, please bring to you appointment      Do you have any difficulty bathing or dressing yourself? No   Do you have any difficulty preparing food or eating? No   Do you have any difficulty managing your medications? No   Do you have any difficulty managing your finances? No   Do you have any difficulty affording your medications? No  Social Drivers of Corporate investment banker Strain: Not on file  Food Insecurity: No Food Insecurity (07/21/2023)   Hunger Vital Sign    Worried About Running Out of Food  in the Last Year: Never true    Ran Out of Food in the Last Year: Never true  Transportation Needs: No Transportation Needs (07/21/2023)   PRAPARE - Administrator, Civil Service (Medical): No    Lack of Transportation (Non-Medical): No  Physical Activity: Not on file  Stress: Not on file  Social Connections: Not on file    Tobacco Counseling Counseling given: Not Answered   Clinical Intake:  Pre-visit preparation completed: Yes  Pain : No/denies pain     BMI - recorded: 20 Diabetes: No  How often do you need to have someone help you when you read instructions, pamphlets, or other written materials from your doctor or pharmacy?: 1 - Never         Activities of Daily Living    07/21/2023   10:35 AM  In your present state of health, do you have any difficulty performing the following activities:  Hearing? 1  Vision? 0  Difficulty concentrating or making decisions? 1  Walking or climbing stairs? 0  Dressing or bathing? 0  Doing errands, shopping? 0  Preparing Food and eating ? N  Using the Toilet? N  In the past six months, have you accidently leaked urine? Y  Do you have problems with loss of bowel control? N  Managing your Medications? N  Managing your Finances? N  Housekeeping or managing your Housekeeping? N    Patient Care Team: Sharon Seller, NP as PCP - General (Geriatric Medicine)  Indicate any recent Medical Services you may have received from other than [facility] providers in the past year (date may be approximate).     Assessment:   This is a routine wellness examination for [place].  Hearing/Vision screen Vision Screening - Comments:: [facility] Last Eye Exam:2025   Goals Addressed   None    Depression Screen    07/21/2023   10:39 AM 01/20/2023    9:27 AM 07/15/2022   10:22 AM 03/21/2021    1:52 PM  PHQ 2/9 Scores  PHQ - 2 Score 0 0 0 0    Fall Risk    07/21/2023   10:39 AM 01/20/2023    9:26 AM 07/15/2022   10:22 AM  01/07/2022    9:37 AM 07/02/2021   10:18 AM  Fall Risk   Falls in the past year? 1 0 0 0 0  Number falls in past yr: 1 0 0 0 0  Injury with Fall? 1 0 0 0 0  Risk for fall due to : Impaired balance/gait No Fall Risks  No Fall Risks No Fall Risks  Follow up Falls evaluation completed Falls evaluation completed  Falls evaluation completed Falls evaluation completed    MEDICARE RISK AT HOME: Medicare Risk at Home Any stairs in or around the home?: Yes Home free of loose throw rugs in walkways, pet beds, electrical cords, etc?: Yes Adequate lighting in your home to reduce risk of falls?: Yes Life alert?: No Use of a cane, walker or w/c?: No Grab bars in the bathroom?: Yes Shower chair or bench in shower?: No Elevated toilet seat or a handicapped toilet?: Yes  TIMED UP AND GO:  Was the test performed?  No    Cognitive Function:  07/21/2023   10:39 AM 07/15/2022   10:30 AM 03/21/2021    1:56 PM  6CIT Screen  What Year? 0 points 0 points 0 points  What month? 0 points 0 points 0 points  What time? 0 points 0 points 0 points  Count back from 20 0 points 0 points 0 points  Months in reverse 0 points 0 points 0 points  Repeat phrase 0 points 0 points 2 points  Total Score 0 points 0 points 2 points    Immunizations Immunization History  Administered Date(s) Administered   Influenza, High Dose Seasonal PF 01/22/2018, 01/28/2021   Influenza-Unspecified 03/14/2013, 12/14/2019, 01/28/2022, 03/09/2023   Moderna Sars-Covid-2 Vaccination 04/26/2019, 05/24/2019, 10/03/2020   Pneumococcal Conjugate-13 04/24/2015   Pneumococcal Polysaccharide-23 07/03/2021   Td 05/18/2018   Tdap 05/18/2018, 09/07/2020   Zoster Recombinant(Shingrix) 10/29/2020, 04/14/2021    TDAP status: Up to date  Flu Vaccine status: Up to date  Pneumococcal vaccine status: Up to date  Covid-19 vaccine status: Information provided on how to obtain vaccines.   Qualifies for Shingles Vaccine? Yes   Zostavax  completed No   Shingrix Completed?: Yes  Screening Tests Health Maintenance  Topic Date Due   COVID-19 Vaccine (4 - 2024-25 season) 12/14/2022   INFLUENZA VACCINE  11/13/2023   Medicare Annual Wellness (AWV)  07/20/2024   DTaP/Tdap/Td (4 - Td or Tdap) 09/08/2030   Pneumonia Vaccine 12+ Years old  Completed   DEXA SCAN  Completed   Zoster Vaccines- Shingrix  Completed   HPV VACCINES  Aged Out    Health Maintenance  Health Maintenance Due  Topic Date Due   COVID-19 Vaccine (4 - 2024-25 season) 12/14/2022    Colorectal cancer screening: No longer required.   Mammogram status: No longer required due to age.  Declines bone density   Lung Cancer Screening: (Low Dose CT Chest recommended if Age 70-80 years, 20 pack-year currently smoking OR have quit w/in 15years.) does not qualify.   Lung Cancer Screening Referral: na  Additional Screening:  Hepatitis C Screening: does not qualify; Completed na  Vision Screening: Recommended annual ophthalmology exams for early detection of glaucoma and other disorders of the eye. Is the patient up to date with their annual eye exam?  Yes  Who is the provider or what is the name of the office in which the patient attends annual eye exams? [facility] If pt is not established with a provider, would they like to be referred to a provider to establish care? No .   Dental Screening: Recommended annual dental exams for proper oral hygiene   Community Resource Referral / Chronic Care Management: CRR required this visit?  No   CCM required this visit?  No     Plan:     I have personally reviewed and noted the following in the patient's chart:   Medical and social history Use of alcohol, tobacco or illicit drugs  Current medications and supplements including opioid prescriptions. Patient is not currently taking opioid prescriptions. Functional ability and status Nutritional status Physical activity Advanced directives List  of other physicians Hospitalizations, surgeries, and ER visits in previous 12 months Vitals Screenings to include cognitive, depression, and falls Referrals and appointments  In addition, I have reviewed and discussed with patient certain preventive protocols, quality metrics, and best practice recommendations. A written personalized care plan for preventive services as well as general preventive health recommendations were provided to patient.     Sharon Seller, NP  07/21/2023        

## 2023-09-20 ENCOUNTER — Other Ambulatory Visit: Payer: Self-pay | Admitting: Nurse Practitioner

## 2023-10-06 ENCOUNTER — Encounter: Payer: Self-pay | Admitting: Nurse Practitioner

## 2023-10-06 ENCOUNTER — Ambulatory Visit: Admitting: Nurse Practitioner

## 2023-10-06 VITALS — BP 130/78 | HR 75 | Temp 97.7°F | Resp 18 | Ht 63.0 in | Wt 119.8 lb

## 2023-10-06 DIAGNOSIS — K432 Incisional hernia without obstruction or gangrene: Secondary | ICD-10-CM | POA: Diagnosis not present

## 2023-10-06 DIAGNOSIS — R600 Localized edema: Secondary | ICD-10-CM | POA: Diagnosis not present

## 2023-10-06 DIAGNOSIS — I872 Venous insufficiency (chronic) (peripheral): Secondary | ICD-10-CM | POA: Diagnosis not present

## 2023-10-06 NOTE — Progress Notes (Signed)
 Careteam: Patient Care Team: Caro Harlene POUR, NP as PCP - General (Geriatric Medicine) PLACE OF SERVICE:  [facility]   Advanced Directive information Does Patient Have a Medical Advance Directive?: Yes, Type of Advance Directive: Healthcare Power of [place];Living will, Does patient want to make changes to medical advance directive?: No - Patient declined  Allergies  Allergen Reactions   Sulfa Antibiotics Nausea Only    Chief Complaint  Patient presents with   possible hernia     HPI: Patient is a 89 y.o. female seen in today at [facility] Discussed the use of AI scribe software for clinical note transcription with the patient, who gave verbal consent to proceed.  History of Present Illness Sharon Hood is a 89 year old female who presents with intermittent abdominal protrusion and pain.  She describes a sudden appearance of a hard, rolling knot in her abdomen, which lacks a predictable pattern of occurrence. The abdominal protrusion is not present at the time of the visit and is not currently causing discomfort. No associated abdominal pain, constipation, diarrhea, or fever. The knot becomes hard and painful when it appears, but she is usually able to push it back in. Episodes last about ten minutes and occur every few weeks.  She has a history of appendicitis surgery. She recalls the knot appearing once over the past weekend and cannot recall the exact frequency of previous occurrences, but estimates it to be weeks apart. Her son-in-law, who felt the knot, suggested it might be a hernia based on his own experience. She mentions her grandson experienced similar symptoms, but she is unsure of his current status or if he underwent surgery.  Additionally, she reports slight swelling in her left foot, which she manages by propping it up and increasing her water intake. The swelling resolves overnight but returns quickly during the day. She recalls being advised to use  compression stockings in the past for vein issues but found them uncomfortable.  Review of Systems:  Review of Systems  Constitutional:  Negative for chills, fever and weight loss.  HENT:  Negative for tinnitus.   Respiratory:  Negative for cough, sputum production and shortness of breath.   Cardiovascular:  Positive for leg swelling. Negative for chest pain and palpitations.  Gastrointestinal:  Negative for abdominal pain, constipation, diarrhea, heartburn, nausea and vomiting.  Genitourinary:  Negative for dysuria, frequency and urgency.  Musculoskeletal:  Negative for back pain, falls, joint pain and myalgias.  Skin: Negative.   Neurological:  Negative for dizziness and headaches.  Psychiatric/Behavioral:  Negative for depression and memory loss. The patient does not have insomnia.     Past Medical History:  Diagnosis Date   Arthritis    Asthma    Black-out (not amnesia) 2016   Cataract    OS   Vertigo    2 episodes.  Most recent approx 2019   Wears hearing aid in both ears    Past Surgical History:  Procedure Laterality Date   APPENDECTOMY     CATARACT EXTRACTION Right    CATARACT EXTRACTION W/PHACO Left 06/05/2020   Procedure: CATARACT EXTRACTION PHACO AND INTRAOCULAR LENS PLACEMENT (IOC) LEFT 8.23 00:44.3;  Surgeon: Jaye Fallow, MD;  Location: [facility];  Service: Ophthalmology;  Laterality: Left;   EYE SURGERY Right 05/24/2018   Cat Sx   TONSILLECTOMY     8 yr old   Social History:   reports that she quit smoking about 60 years ago. Her smoking use  included cigarettes. She has never used smokeless tobacco. She reports current alcohol  use. She reports that she does not use drugs.  Family History  Problem Relation Age of Onset   Other Mother        38 yrs old   Cancer Father    Asthma Brother     Medications: Patient's Medications  New Prescriptions   No medications on file  Previous Medications   ALBUTEROL (VENTOLIN HFA) 108 (90 BASE)  MCG/ACT INHALER    Inhale into the lungs every 6 (six) hours as needed for wheezing or shortness of breath.   CALCIUM  CARBONATE (OSCAL) 1500 (600 CA) MG TABS TABLET    Take 1,500 mg daily with breakfast by mouth.    DOXYCYCLINE  (VIBRA -TABS) 100 MG TABLET    Take 1 tablet (100 mg total) by mouth 2 (two) times daily.   MONTELUKAST  (SINGULAIR ) 10 MG TABLET    TAKE 1 TABLET (10 MG TOTAL) BY MOUTH EVERY MORNING.   WIXELA INHUB 250-50 MCG/ACT AEPB    INHALE 1 PUFF INTO THE LUNGS IN THE MORNING AND AT BEDTIME.  Modified Medications   No medications on file  Discontinued Medications   No medications on file    Physical Exam:  Vitals:   10/06/23 1026  BP: 130/78  Pulse: 75  Resp: 18  Temp: 97.7 F (36.5 C)  SpO2: 96%  Weight: 119 lb 12.8 oz (54.3 kg)  Height: 5' 3 (1.6 m)   Body mass index is 21.22 kg/m. Wt Readings from Last 3 Encounters:  10/06/23 119 lb 12.8 oz (54.3 kg)  07/21/23 117 lb (53.1 kg)  05/05/23 117 lb (53.1 kg)    Physical Exam Constitutional:      General: She is not in acute distress.    Appearance: She is well-developed. She is not diaphoretic.  HENT:     Head: Normocephalic and atraumatic.     Mouth/Throat:     Pharynx: No oropharyngeal exudate.   Eyes:     Conjunctiva/sclera: Conjunctivae normal.     Pupils: Pupils are equal, round, and reactive to light.    Cardiovascular:     Rate and Rhythm: Normal rate and regular rhythm.     Heart sounds: Normal heart sounds.  Pulmonary:     Effort: Pulmonary effort is normal.     Breath sounds: Normal breath sounds.  Abdominal:     General: Bowel sounds are normal.     Palpations: Abdomen is soft.   Musculoskeletal:     Cervical back: Normal range of motion and neck supple.     Right lower leg: Edema (trace) present.     Left lower leg: Edema (trace) present.   Skin:    General: Skin is warm and dry.   Neurological:     Mental Status: She is alert.   Psychiatric:        Mood and Affect: Mood  normal.     Labs reviewed: Basic Metabolic Panel: Recent Labs    03/26/23 0820  NA 133*  K 4.8  CL 96*  CO2 30  GLUCOSE 92  BUN 10  CREATININE 0.63  CALCIUM  9.4   Liver Function Tests: Recent Labs    03/26/23 0820  AST 37*  ALT 18  BILITOT 1.1  PROT 6.9   No results for input(s): LIPASE, AMYLASE in the last 8760 hours. No results for input(s): AMMONIA in the last 8760 hours. CBC: Recent Labs    03/26/23 0820  WBC 8.9  NEUTROABS 5,269  HGB 14.3  HCT 43.0  MCV 90.5  PLT 445*   Lipid Panel: No results for input(s): CHOL, HDL, LDLCALC, TRIG, CHOLHDL, LDLDIRECT in the last 8760 hours. TSH: No results for input(s): TSH in the last 8760 hours. A1C: No results found for: HGBA1C   Assessment/Plan Assessment and Plan Assessment & Plan Incisional Hernia Intermittent abdominal protrusion, likely incisional hernia from previous appendectomy. Discussed risks of incarceration and strangulation leading to bowel ischemia and sepsis. Elective surgery not recommended due to anesthesia risks unless hernia becomes incarcerated. - Monitor for signs of incarceration: prolonged protrusion, pain, inability to reduce. - Seek emergency care if hernia becomes incarcerated or strangulated. - Avoid heavy lifting and manage constipation.  Peripheral Edema Mild left foot swelling likely due to venous insufficiency, possibly exacerbated by previous vein issues. Compression therapy previously recommended but not tolerated. - Recommend wearing compression hose or socks if tolerated. Elevating LE Low sodium diet.    Ean Gettel K. Caro BODILY  [facility] [phone]

## 2024-01-02 ENCOUNTER — Other Ambulatory Visit: Payer: Self-pay | Admitting: Nurse Practitioner

## 2024-01-04 ENCOUNTER — Ambulatory Visit: Payer: Self-pay

## 2024-01-04 NOTE — Telephone Encounter (Signed)
 FYI Only or Action Required?: FYI only for provider. Scheduled patient for first available at [facility] which is Thursday 01/07/2024 at 8:20 AM. Doesn't want to come to office.   Patient was last seen in primary care on 10/06/2023 by Caro Harlene POUR, NP.  Called Nurse Triage reporting Fall.  Symptoms began several days ago.  Interventions attempted: Rest, hydration, or home remedies.  Symptoms are: unchanged.  Triage Disposition: See PCP When Office is Open (Within 3 Days)  Patient/caregiver understands and will follow disposition?: Yes  Copied from CRM #[id number]. Topic: Clinical - Red Word Triage >> Jan 04, 2024 10:29 AM Mercer PEDLAR wrote: Red Word that prompted transfer to Nurse Triage:  Fall on Friday 01/01/24, having rib pain and pain while walking. Reason for Disposition  MILD weakness (e.g., does not interfere with ability to work, go to school, normal activities)  (Exception: Mild weakness is a chronic symptom.)  Answer Assessment - Initial Assessment Questions Discussed with patient options to be seen sooner in [facility] office. Patient only wants to be seen at [facility]-patient is scheduled for first available, Thursday 01/07/2024 at 8:20 AM-did place patient on wait list to be seen sooner.   1. MECHANISM: How did the fall happen?     Patient fell after tripping on a 2 inch sidewalk 2. DOMESTIC VIOLENCE AND ELDER ABUSE SCREENING: Did you fall because someone pushed you or tried to hurt you? If Yes, ask: Are you safe now?     no 3. ONSET: When did the fall happen? (e.g., minutes, hours, or days ago)     01/01/2024-was out of town 4. LOCATION: What part of the body hit the ground? (e.g., back, buttocks, head, hips, knees, hands, head, stomach)     Right side of body hit the ground 5. INJURY: Did you hurt (injure) yourself when you fell? If Yes, ask: What did you injure? Tell me more about this? (e.g., body area; type of injury; pain severity)     Right rib  area 6. PAIN: Is there any pain? If Yes, ask: How bad is the pain? (e.g., Scale 0-10; or none, mild,      Pain is dependent on what she is doing. Currently no pain 7. SIZE: For cuts, bruises, or swelling, ask: How large is it? (e.g., inches or centimeters)      Bruising to the rib area 9. OTHER SYMPTOMS: Do you have any other symptoms? (e.g., dizziness, fever, weakness; new-onset or worsening).      no 10. CAUSE: What do you think caused the fall (or falling)? (e.g., dizzy spell, tripped)       tripped  Protocols used: Falls and [facility]

## 2024-01-04 NOTE — Telephone Encounter (Signed)
 Noted thank you

## 2024-01-04 NOTE — Telephone Encounter (Signed)
 Message routed to PCP Caro, Harlene POUR, NP

## 2024-01-04 NOTE — Telephone Encounter (Signed)
 FYI-see triage-scheduled for first available at [facility] on Thursday. Patient was wanting to be seen sooner-doesn't want to come to the office. Did put patient on wait list.

## 2024-01-07 ENCOUNTER — Non-Acute Institutional Stay: Admitting: Nurse Practitioner

## 2024-01-07 ENCOUNTER — Encounter: Payer: Self-pay | Admitting: Nurse Practitioner

## 2024-01-07 VITALS — BP 136/82 | HR 77 | Temp 98.0°F | Ht 63.0 in | Wt 122.8 lb

## 2024-01-07 DIAGNOSIS — R0781 Pleurodynia: Secondary | ICD-10-CM

## 2024-01-07 DIAGNOSIS — R600 Localized edema: Secondary | ICD-10-CM

## 2024-01-07 DIAGNOSIS — M810 Age-related osteoporosis without current pathological fracture: Secondary | ICD-10-CM

## 2024-01-07 DIAGNOSIS — J4521 Mild intermittent asthma with (acute) exacerbation: Secondary | ICD-10-CM

## 2024-01-07 DIAGNOSIS — I872 Venous insufficiency (chronic) (peripheral): Secondary | ICD-10-CM

## 2024-01-07 NOTE — Progress Notes (Signed)
 Careteam: Patient Care Team: Sharon Harlene POUR, NP as PCP - General (Geriatric Medicine) PLACE OF SERVICE:  [facility]   Advanced Directive information Does Patient Have a Medical Advance Directive?: Yes, Type of Advance Directive: Healthcare Power of Attorney, Does patient want to make changes to medical advance directive?: No - Patient declined  Allergies  Allergen Reactions   Sulfa Antibiotics Nausea Only    Chief Complaint  Patient presents with   Rib Injury    Rib Pain. Fell last Friday 9/19 while on Vacation.      HPI: Patient is a 89 y.o. female seen in today at [facility] for follow up  Discussed the use of AI scribe software for clinical note transcription with the patient, who gave verbal consent to proceed.  History of Present Illness Sharon Hood is a 89 year old female who presents with rib pain following a fall.  She experienced rib pain after a fall at her granddaughter's apartment in [place]. The fall occurred due to a misstep on a porch with a slight elevation difference, causing her to land in a flower bed with a brick border, which she believes she hit during the fall.  The rib pain is primarily on the right side and is not constant. It worsens with certain movements such as getting in and out of a car or bed, leading her to sleep on the sofa for easier access. She does not experience pain while standing or walking.  She has been taking Advil for the pain since last Friday, nearly a week ago, with several doses taken daily  No breathing difficulties, and she can take deep breaths without pain, although she feels the rib area when doing so. No issues with bowel movements, although she experienced a brief period of constipation followed by resolution.      Review of Systems:  Review of Systems  Constitutional:  Negative for chills, fever and weight loss.  HENT:  Negative for tinnitus.   Respiratory:  Negative for cough, sputum  production and shortness of breath.   Cardiovascular:  Negative for chest pain, palpitations and leg swelling.  Gastrointestinal:  Negative for abdominal pain, constipation, diarrhea and heartburn.  Genitourinary:  Negative for dysuria, frequency and urgency.  Musculoskeletal:  Negative for back pain, falls, joint pain and myalgias.  Skin: Negative.   Neurological:  Negative for dizziness and headaches.  Psychiatric/Behavioral:  Negative for depression and memory loss. The patient does not have insomnia.    Past Medical History:  Diagnosis Date   Arthritis    Asthma    Black-out (not amnesia) 2016   Cataract    OS   Vertigo    2 episodes.  Most recent approx 2019   Wears hearing aid in both ears    Past Surgical History:  Procedure Laterality Date   APPENDECTOMY     CATARACT EXTRACTION Right    CATARACT EXTRACTION W/PHACO Left 06/05/2020   Procedure: CATARACT EXTRACTION PHACO AND INTRAOCULAR LENS PLACEMENT (IOC) LEFT 8.23 00:44.3;  Surgeon: Jaye Fallow, MD;  Location: [facility];  Service: Ophthalmology;  Laterality: Left;   EYE SURGERY Right 05/24/2018   Cat Sx   TONSILLECTOMY     8 yr old   Social History:   reports that she quit smoking about 61 years ago. Her smoking use included cigarettes. She has never used smokeless tobacco. She reports current alcohol  use. She reports that she does not use drugs.  Family History  Problem Relation Age of Onset   Other Mother        66 yrs old   Cancer Father    Asthma Brother     Medications: Patient's Medications  New Prescriptions   No medications on file  Previous Medications   ALBUTEROL (VENTOLIN HFA) 108 (90 BASE) MCG/ACT INHALER    Inhale into the lungs every 6 (six) hours as needed for wheezing or shortness of breath.   CALCIUM  CARBONATE (OSCAL) 1500 (600 CA) MG TABS TABLET    Take 1,500 mg daily with breakfast by mouth.    DOXYCYCLINE  (VIBRA -TABS) 100 MG TABLET    Take 1 tablet (100 mg total) by mouth 2  (two) times daily.   MONTELUKAST  (SINGULAIR ) 10 MG TABLET    TAKE 1 TABLET (10 MG TOTAL) BY MOUTH EVERY MORNING.   WIXELA INHUB 250-50 MCG/ACT AEPB    INHALE 1 PUFF INTO THE LUNGS IN THE MORNING AND AT BEDTIME.  Modified Medications   No medications on file  Discontinued Medications   No medications on file    Physical Exam:  Vitals:   01/07/24 0825  BP: 136/82  Pulse: 77  Temp: 98 F (36.7 C)  SpO2: 98%  Weight: 122 lb 12.8 oz (55.7 kg)  Height: 5' 3 (1.6 m)   Body mass index is 21.75 kg/m. Wt Readings from Last 3 Encounters:  01/07/24 122 lb 12.8 oz (55.7 kg)  10/06/23 119 lb 12.8 oz (54.3 kg)  07/21/23 117 lb (53.1 kg)    Physical Exam Constitutional:      General: She is not in acute distress.    Appearance: She is well-developed. She is not diaphoretic.  HENT:     Head: Normocephalic and atraumatic.     Mouth/Throat:     Pharynx: No oropharyngeal exudate.  Eyes:     Conjunctiva/sclera: Conjunctivae normal.     Pupils: Pupils are equal, round, and reactive to light.  Cardiovascular:     Rate and Rhythm: Normal rate and regular rhythm.     Heart sounds: Normal heart sounds.  Pulmonary:     Effort: Pulmonary effort is normal.     Breath sounds: Normal breath sounds.  Abdominal:     General: Bowel sounds are normal.     Palpations: Abdomen is soft.  Musculoskeletal:     Cervical back: Normal range of motion and neck supple.     Right lower leg: No edema.     Left lower leg: No edema.  Skin:    General: Skin is warm and dry.     Findings: Bruising (noted to right side) present.  Neurological:     Mental Status: She is alert.  Psychiatric:        Mood and Affect: Mood normal.     Labs reviewed: Basic Metabolic Panel: Recent Labs    03/26/23 0820  NA 133*  K 4.8  CL 96*  CO2 30  GLUCOSE 92  BUN 10  CREATININE 0.63  CALCIUM  9.4   Liver Function Tests: Recent Labs    03/26/23 0820  AST 37*  ALT 18  BILITOT 1.1  PROT 6.9   No results  for input(s): LIPASE, AMYLASE in the last 8760 hours. No results for input(s): AMMONIA in the last 8760 hours. CBC: Recent Labs    03/26/23 0820  WBC 8.9  NEUTROABS 5,269  HGB 14.3  HCT 43.0  MCV 90.5  PLT 445*   Lipid Panel: No results for input(s): CHOL, HDL, LDLCALC, TRIG,  CHOLHDL, LDLDIRECT in the last 8760 hours. TSH: No results for input(s): TSH in the last 8760 hours. A1C: No results found for: HGBA1C   Assessment/Plan Assessment and Plan Assessment & Plan Right rib pain after fall Intermittent right rib pain post-fall, likely due to possible rib fracture. No respiratory distress noted. - Discontinue Advil due to potential adverse effects. - Initiate Tylenol  325 mg, two tablets TID for pain. - Schedule blood work for kidney function.  Lower extremity contusions and edema after fall Bruising and mild swelling in lower extremities post-fall. Edema at baseline  Mild intermittent asthma with exacerbation Controlled on current regimen  Age-related osteoporosis without current pathological fracture -Recommended to take calcium  600 mg twice daily with Vitamin D 2000 units daily and weight bearing activity 30 mins/5 days a week  General Health Maintenance Discussed flu and COVID vaccinations. She regularly receives flu shots but is hesitant about the COVID vaccine. - Encourage flu vaccination at [facility]. - Discuss COVID vaccination, respect her decision to decline unless required.     Next appt: yearly, keep AWV in 6 months Lab work next week [employer]. Sharon Hood  [facility] [phone]

## 2024-01-07 NOTE — Patient Instructions (Addendum)
 Tylenol  325 mg 2 tablets every 6 hours as needed for pain  to avoid NSAIDS (Aleve, Advil, Motrin, Ibuprofen) - can cause kidney functions, GI and heart problems.   To come to [facility] at 7:30 on Monday Sept 29th for lab work.

## 2024-01-11 DIAGNOSIS — R0781 Pleurodynia: Secondary | ICD-10-CM | POA: Diagnosis not present

## 2024-01-11 LAB — CBC WITH DIFFERENTIAL/PLATELET
Absolute Lymphocytes: 2405 {cells}/uL (ref 850–3900)
Absolute Monocytes: 785 {cells}/uL (ref 200–950)
Basophils Absolute: 72 {cells}/uL (ref 0–200)
Basophils Relative: 1 %
Eosinophils Absolute: 302 {cells}/uL (ref 15–500)
Eosinophils Relative: 4.2 %
HCT: 43.2 % (ref 35.0–45.0)
Hemoglobin: 14.4 g/dL (ref 11.7–15.5)
MCH: 30.2 pg (ref 27.0–33.0)
MCHC: 33.3 g/dL (ref 32.0–36.0)
MCV: 90.6 fL (ref 80.0–100.0)
MPV: 8.8 fL (ref 7.5–12.5)
Monocytes Relative: 10.9 %
Neutro Abs: 3636 {cells}/uL (ref 1500–7800)
Neutrophils Relative %: 50.5 %
Platelets: 460 Thousand/uL — ABNORMAL HIGH (ref 140–400)
RBC: 4.77 Million/uL (ref 3.80–5.10)
RDW: 12 % (ref 11.0–15.0)
Total Lymphocyte: 33.4 %
WBC: 7.2 Thousand/uL (ref 3.8–10.8)

## 2024-01-11 LAB — COMPREHENSIVE METABOLIC PANEL WITH GFR
AG Ratio: 1.6 (calc) (ref 1.0–2.5)
ALT: 13 U/L (ref 6–29)
AST: 29 U/L (ref 10–35)
Albumin: 4.2 g/dL (ref 3.6–5.1)
Alkaline phosphatase (APISO): 85 U/L (ref 37–153)
BUN: 11 mg/dL (ref 7–25)
CO2: 30 mmol/L (ref 20–32)
Calcium: 9.3 mg/dL (ref 8.6–10.4)
Chloride: 97 mmol/L — ABNORMAL LOW (ref 98–110)
Creat: 0.73 mg/dL (ref 0.60–0.95)
Globulin: 2.6 g/dL (ref 1.9–3.7)
Glucose, Bld: 76 mg/dL (ref 65–99)
Potassium: 4.4 mmol/L (ref 3.5–5.3)
Sodium: 136 mmol/L (ref 135–146)
Total Bilirubin: 0.7 mg/dL (ref 0.2–1.2)
Total Protein: 6.8 g/dL (ref 6.1–8.1)
eGFR: 77 mL/min/1.73m2 (ref >=60)

## 2024-01-12 ENCOUNTER — Ambulatory Visit: Payer: Self-pay | Admitting: Nurse Practitioner

## 2024-01-13 ENCOUNTER — Telehealth: Payer: Self-pay

## 2024-01-13 NOTE — Telephone Encounter (Signed)
 Copied from CRM [id number]. Topic: Clinical - Medication Question >> Jan 13, 2024 11:29 AM Miquel SAILOR wrote: Reason for CRM: Tylenol  500 mg-Patient was requested to get 325 mg but they did not have it. Pt question is is it ok to take 3 pills of the 500 mg and for how long does she have to do so. Needs call back [phone]

## 2024-01-13 NOTE — Telephone Encounter (Signed)
 Message sent to PCP Caro Harlene POUR, NP . Please advise.

## 2024-01-13 NOTE — Telephone Encounter (Signed)
 No that is too much, tylenol  500 mg 2 tablets every 8 hours. MAX of 6 tablets in 24 hours.

## 2024-01-14 NOTE — Telephone Encounter (Signed)
 Outgoing call placed to patient, no answer, and left detailed voicemail with providers response. Patient encouraged to return call if she has additional questions, concerns, or comments.

## 2024-01-19 ENCOUNTER — Ambulatory Visit: Payer: Medicare PPO | Admitting: Nurse Practitioner

## 2024-01-26 ENCOUNTER — Ambulatory Visit: Admitting: Nurse Practitioner

## 2024-04-09 ENCOUNTER — Other Ambulatory Visit: Payer: Self-pay | Admitting: Nurse Practitioner

## 2024-07-21 ENCOUNTER — Ambulatory Visit: Payer: Self-pay | Admitting: Nurse Practitioner

## 2025-01-03 ENCOUNTER — Ambulatory Visit: Payer: Self-pay | Admitting: Nurse Practitioner
